# Patient Record
Sex: Female | Born: 1935 | Race: White | Hispanic: No | Marital: Married | Smoking: Former smoker
Health system: Southern US, Community
[De-identification: ages and names within clinical notes are randomized; demographics above are authoritative.]

## PROBLEM LIST (undated history)

## (undated) DIAGNOSIS — C50919 Malignant neoplasm of unspecified site of unspecified female breast: Secondary | ICD-10-CM

## (undated) DIAGNOSIS — I4891 Unspecified atrial fibrillation: Secondary | ICD-10-CM

## (undated) DIAGNOSIS — D696 Thrombocytopenia, unspecified: Secondary | ICD-10-CM

## (undated) DIAGNOSIS — E781 Pure hyperglyceridemia: Secondary | ICD-10-CM

## (undated) DIAGNOSIS — E039 Hypothyroidism, unspecified: Secondary | ICD-10-CM

## (undated) DIAGNOSIS — R0600 Dyspnea, unspecified: Secondary | ICD-10-CM

## (undated) DIAGNOSIS — H3530 Unspecified macular degeneration: Secondary | ICD-10-CM

## (undated) DIAGNOSIS — R002 Palpitations: Secondary | ICD-10-CM

## (undated) HISTORY — DX: Unspecified macular degeneration: H35.30

## (undated) HISTORY — DX: Dyspnea, unspecified: R06.00

## (undated) HISTORY — DX: Unspecified atrial fibrillation: I48.91

## (undated) HISTORY — PX: SHOULDER ARTHROSCOPY: SHX128

## (undated) HISTORY — DX: Pure hyperglyceridemia: E78.1

## (undated) HISTORY — PX: BREAST LUMPECTOMY: SHX2

## (undated) HISTORY — DX: Malignant neoplasm of unspecified site of unspecified female breast: C50.919

## (undated) HISTORY — PX: ROTATOR CUFF REPAIR: SHX139

## (undated) HISTORY — DX: Palpitations: R00.2

## (undated) HISTORY — DX: Thrombocytopenia, unspecified: D69.6

## (undated) HISTORY — DX: Hypothyroidism, unspecified: E03.9

---

## 2000-02-15 ENCOUNTER — Encounter: Payer: Self-pay | Admitting: Orthopedic Surgery

## 2000-02-15 ENCOUNTER — Encounter: Admission: RE | Admit: 2000-02-15 | Discharge: 2000-02-15 | Payer: Self-pay | Admitting: Orthopedic Surgery

## 2002-07-02 ENCOUNTER — Inpatient Hospital Stay (HOSPITAL_COMMUNITY): Admission: EM | Admit: 2002-07-02 | Discharge: 2002-07-04 | Payer: Self-pay | Admitting: *Deleted

## 2002-07-03 ENCOUNTER — Encounter: Payer: Self-pay | Admitting: Family Medicine

## 2002-07-16 ENCOUNTER — Encounter: Admission: RE | Admit: 2002-07-16 | Discharge: 2002-07-16 | Payer: Self-pay | Admitting: Family Medicine

## 2003-08-20 ENCOUNTER — Encounter: Admission: RE | Admit: 2003-08-20 | Discharge: 2003-08-20 | Payer: Self-pay | Admitting: Family Medicine

## 2003-08-20 ENCOUNTER — Encounter: Payer: Self-pay | Admitting: Family Medicine

## 2003-08-29 ENCOUNTER — Encounter: Payer: Self-pay | Admitting: Family Medicine

## 2003-08-29 ENCOUNTER — Encounter (INDEPENDENT_AMBULATORY_CARE_PROVIDER_SITE_OTHER): Payer: Self-pay | Admitting: Specialist

## 2003-08-29 ENCOUNTER — Encounter (INDEPENDENT_AMBULATORY_CARE_PROVIDER_SITE_OTHER): Payer: Self-pay | Admitting: Radiology

## 2003-08-29 ENCOUNTER — Encounter: Admission: RE | Admit: 2003-08-29 | Discharge: 2003-08-29 | Payer: Self-pay | Admitting: Family Medicine

## 2003-09-17 ENCOUNTER — Encounter: Admission: RE | Admit: 2003-09-17 | Discharge: 2003-09-17 | Payer: Self-pay | Admitting: Surgery

## 2003-09-19 ENCOUNTER — Ambulatory Visit (HOSPITAL_COMMUNITY): Admission: RE | Admit: 2003-09-19 | Discharge: 2003-09-19 | Payer: Self-pay | Admitting: Surgery

## 2003-09-19 ENCOUNTER — Encounter (INDEPENDENT_AMBULATORY_CARE_PROVIDER_SITE_OTHER): Payer: Self-pay | Admitting: *Deleted

## 2003-09-19 ENCOUNTER — Encounter: Admission: RE | Admit: 2003-09-19 | Discharge: 2003-09-19 | Payer: Self-pay | Admitting: Surgery

## 2003-09-19 ENCOUNTER — Ambulatory Visit (HOSPITAL_BASED_OUTPATIENT_CLINIC_OR_DEPARTMENT_OTHER): Admission: RE | Admit: 2003-09-19 | Discharge: 2003-09-19 | Payer: Self-pay | Admitting: Surgery

## 2003-10-11 ENCOUNTER — Ambulatory Visit: Admission: RE | Admit: 2003-10-11 | Discharge: 2003-12-04 | Payer: Self-pay | Admitting: Radiation Oncology

## 2003-11-01 ENCOUNTER — Ambulatory Visit (HOSPITAL_COMMUNITY): Admission: RE | Admit: 2003-11-01 | Discharge: 2003-11-01 | Payer: Self-pay | Admitting: Surgery

## 2004-01-02 ENCOUNTER — Ambulatory Visit: Admission: RE | Admit: 2004-01-02 | Discharge: 2004-01-02 | Payer: Self-pay | Admitting: Radiation Oncology

## 2004-05-07 ENCOUNTER — Ambulatory Visit: Admission: RE | Admit: 2004-05-07 | Discharge: 2004-05-07 | Payer: Self-pay | Admitting: Radiation Oncology

## 2004-05-12 ENCOUNTER — Encounter: Admission: RE | Admit: 2004-05-12 | Discharge: 2004-05-12 | Payer: Self-pay | Admitting: Radiation Oncology

## 2004-10-05 ENCOUNTER — Ambulatory Visit: Payer: Self-pay | Admitting: Family Medicine

## 2004-11-05 ENCOUNTER — Ambulatory Visit: Admission: RE | Admit: 2004-11-05 | Discharge: 2004-11-05 | Payer: Self-pay | Admitting: Radiation Oncology

## 2004-11-11 ENCOUNTER — Encounter: Admission: RE | Admit: 2004-11-11 | Discharge: 2004-11-11 | Payer: Self-pay | Admitting: Family Medicine

## 2004-11-23 ENCOUNTER — Ambulatory Visit: Payer: Self-pay | Admitting: Hematology & Oncology

## 2005-01-19 ENCOUNTER — Ambulatory Visit: Payer: Self-pay | Admitting: Family Medicine

## 2005-03-08 ENCOUNTER — Ambulatory Visit: Payer: Self-pay | Admitting: Family Medicine

## 2005-03-10 ENCOUNTER — Encounter: Admission: RE | Admit: 2005-03-10 | Discharge: 2005-03-10 | Payer: Self-pay | Admitting: Orthopedic Surgery

## 2005-03-11 ENCOUNTER — Ambulatory Visit (HOSPITAL_BASED_OUTPATIENT_CLINIC_OR_DEPARTMENT_OTHER): Admission: RE | Admit: 2005-03-11 | Discharge: 2005-03-11 | Payer: Self-pay | Admitting: Orthopedic Surgery

## 2005-03-11 ENCOUNTER — Ambulatory Visit (HOSPITAL_COMMUNITY): Admission: RE | Admit: 2005-03-11 | Discharge: 2005-03-11 | Payer: Self-pay | Admitting: Orthopedic Surgery

## 2005-05-05 ENCOUNTER — Ambulatory Visit: Admission: RE | Admit: 2005-05-05 | Discharge: 2005-05-05 | Payer: Self-pay | Admitting: Radiation Oncology

## 2005-05-21 ENCOUNTER — Ambulatory Visit: Payer: Self-pay | Admitting: Hematology & Oncology

## 2005-06-10 ENCOUNTER — Ambulatory Visit: Payer: Self-pay | Admitting: Internal Medicine

## 2005-06-15 ENCOUNTER — Ambulatory Visit: Payer: Self-pay | Admitting: Family Medicine

## 2005-06-22 ENCOUNTER — Ambulatory Visit: Payer: Self-pay

## 2005-07-26 ENCOUNTER — Ambulatory Visit: Payer: Self-pay | Admitting: Cardiology

## 2005-08-09 ENCOUNTER — Ambulatory Visit: Payer: Self-pay | Admitting: Family Medicine

## 2005-09-17 ENCOUNTER — Ambulatory Visit: Payer: Self-pay | Admitting: Hematology & Oncology

## 2005-09-28 ENCOUNTER — Encounter: Payer: Self-pay | Admitting: Hematology & Oncology

## 2005-09-28 ENCOUNTER — Encounter (INDEPENDENT_AMBULATORY_CARE_PROVIDER_SITE_OTHER): Payer: Self-pay | Admitting: *Deleted

## 2005-09-28 ENCOUNTER — Ambulatory Visit (HOSPITAL_COMMUNITY): Admission: RE | Admit: 2005-09-28 | Discharge: 2005-09-28 | Payer: Self-pay | Admitting: Hematology & Oncology

## 2005-10-01 ENCOUNTER — Ambulatory Visit: Payer: Self-pay | Admitting: Hematology & Oncology

## 2005-10-06 ENCOUNTER — Ambulatory Visit: Payer: Self-pay

## 2005-10-19 ENCOUNTER — Ambulatory Visit (HOSPITAL_COMMUNITY): Admission: RE | Admit: 2005-10-19 | Discharge: 2005-10-19 | Payer: Self-pay | Admitting: Hematology & Oncology

## 2005-10-21 ENCOUNTER — Ambulatory Visit: Payer: Self-pay | Admitting: Family Medicine

## 2005-11-16 ENCOUNTER — Ambulatory Visit: Payer: Self-pay | Admitting: Hematology & Oncology

## 2005-11-24 ENCOUNTER — Encounter: Admission: RE | Admit: 2005-11-24 | Discharge: 2005-11-24 | Payer: Self-pay | Admitting: Family Medicine

## 2006-01-26 ENCOUNTER — Ambulatory Visit: Payer: Self-pay | Admitting: Hematology & Oncology

## 2006-03-08 ENCOUNTER — Ambulatory Visit: Payer: Self-pay | Admitting: Family Medicine

## 2006-04-16 ENCOUNTER — Emergency Department (HOSPITAL_COMMUNITY): Admission: EM | Admit: 2006-04-16 | Discharge: 2006-04-16 | Payer: Self-pay | Admitting: Emergency Medicine

## 2006-05-02 ENCOUNTER — Ambulatory Visit: Payer: Self-pay | Admitting: Hematology & Oncology

## 2006-05-13 LAB — CHCC SMEAR

## 2006-05-13 LAB — CBC WITH DIFFERENTIAL/PLATELET
Basophils Absolute: 0 10*3/uL (ref 0.0–0.1)
EOS%: 2.5 % (ref 0.0–7.0)
Eosinophils Absolute: 0.1 10*3/uL (ref 0.0–0.5)
HGB: 12.4 g/dL (ref 11.6–15.9)
MCV: 89.3 fL (ref 81.0–101.0)
MONO%: 14.5 % — ABNORMAL HIGH (ref 0.0–13.0)
NEUT#: 1.6 10*3/uL (ref 1.5–6.5)
RBC: 4.09 10*6/uL (ref 3.70–5.32)
RDW: 14.2 % (ref 11.3–14.5)
lymph#: 0.9 10*3/uL (ref 0.9–3.3)

## 2006-08-03 ENCOUNTER — Ambulatory Visit: Payer: Self-pay | Admitting: Family Medicine

## 2006-08-17 ENCOUNTER — Ambulatory Visit: Payer: Self-pay | Admitting: Hematology & Oncology

## 2006-08-19 LAB — CBC WITH DIFFERENTIAL/PLATELET
BASO%: 0.4 % (ref 0.0–2.0)
EOS%: 3.7 % (ref 0.0–7.0)
HCT: 36.5 % (ref 34.8–46.6)
MCH: 30.4 pg (ref 26.0–34.0)
MCHC: 34.3 g/dL (ref 32.0–36.0)
MONO#: 0.5 10*3/uL (ref 0.1–0.9)
NEUT%: 49.8 % (ref 39.6–76.8)
RBC: 4.13 10*6/uL (ref 3.70–5.32)
RDW: 13.9 % (ref 11.3–14.5)
WBC: 2.9 10*3/uL — ABNORMAL LOW (ref 3.9–10.0)
lymph#: 0.9 10*3/uL (ref 0.9–3.3)

## 2006-08-19 LAB — CHCC SMEAR

## 2006-09-08 ENCOUNTER — Emergency Department (HOSPITAL_COMMUNITY): Admission: EM | Admit: 2006-09-08 | Discharge: 2006-09-08 | Payer: Self-pay | Admitting: Emergency Medicine

## 2006-11-15 ENCOUNTER — Ambulatory Visit: Payer: Self-pay | Admitting: Hematology & Oncology

## 2006-11-18 LAB — CBC WITH DIFFERENTIAL/PLATELET
BASO%: 0.6 % (ref 0.0–2.0)
Eosinophils Absolute: 0.1 10*3/uL (ref 0.0–0.5)
HCT: 38.6 % (ref 34.8–46.6)
MCHC: 32.1 g/dL (ref 32.0–36.0)
MONO#: 0.5 10*3/uL (ref 0.1–0.9)
NEUT#: 1.9 10*3/uL (ref 1.5–6.5)
RBC: 4.35 10*6/uL (ref 3.70–5.32)
WBC: 3.8 10*3/uL — ABNORMAL LOW (ref 3.9–10.0)
lymph#: 1.1 10*3/uL (ref 0.9–3.3)

## 2007-02-07 ENCOUNTER — Ambulatory Visit: Payer: Self-pay | Admitting: Hematology & Oncology

## 2007-02-07 ENCOUNTER — Ambulatory Visit: Payer: Self-pay | Admitting: Family Medicine

## 2007-02-10 LAB — CHCC SMEAR

## 2007-02-10 LAB — CBC WITH DIFFERENTIAL/PLATELET
Basophils Absolute: 0 10*3/uL (ref 0.0–0.1)
Eosinophils Absolute: 0.1 10*3/uL (ref 0.0–0.5)
HGB: 12.6 g/dL (ref 11.6–15.9)
MCV: 87.5 fL (ref 81.0–101.0)
MONO#: 0.4 10*3/uL (ref 0.1–0.9)
MONO%: 13.4 % — ABNORMAL HIGH (ref 0.0–13.0)
NEUT#: 1.6 10*3/uL (ref 1.5–6.5)
Platelets: 89 10*3/uL — ABNORMAL LOW (ref 145–400)
RBC: 4.22 10*6/uL (ref 3.70–5.32)
RDW: 12.2 % (ref 11.3–14.5)
WBC: 3 10*3/uL — ABNORMAL LOW (ref 3.9–10.0)

## 2007-06-08 ENCOUNTER — Ambulatory Visit: Payer: Self-pay | Admitting: Hematology & Oncology

## 2007-06-12 LAB — CBC WITH DIFFERENTIAL/PLATELET
Basophils Absolute: 0 10*3/uL (ref 0.0–0.1)
Eosinophils Absolute: 0 10*3/uL (ref 0.0–0.5)
HCT: 35.5 % (ref 34.8–46.6)
HGB: 12.5 g/dL (ref 11.6–15.9)
LYMPH%: 34.6 % (ref 14.0–48.0)
MCHC: 35.1 g/dL (ref 32.0–36.0)
MONO#: 0.5 10*3/uL (ref 0.1–0.9)
NEUT%: 51.1 % (ref 39.6–76.8)
Platelets: 90 10*3/uL — ABNORMAL LOW (ref 145–400)
WBC: 3.5 10*3/uL — ABNORMAL LOW (ref 3.9–10.0)
lymph#: 1.2 10*3/uL (ref 0.9–3.3)

## 2007-06-12 LAB — CHCC SMEAR

## 2007-10-02 ENCOUNTER — Ambulatory Visit: Payer: Self-pay | Admitting: Hematology & Oncology

## 2007-10-09 LAB — CBC WITH DIFFERENTIAL/PLATELET
Basophils Absolute: 0 10*3/uL (ref 0.0–0.1)
EOS%: 4.1 % (ref 0.0–7.0)
Eosinophils Absolute: 0.1 10*3/uL (ref 0.0–0.5)
HCT: 35 % (ref 34.8–46.6)
HGB: 12.5 g/dL (ref 11.6–15.9)
LYMPH%: 27.6 % (ref 14.0–48.0)
MCH: 31 pg (ref 26.0–34.0)
MCV: 86.9 fL (ref 81.0–101.0)
MONO%: 13 % (ref 0.0–13.0)
NEUT#: 1.7 10*3/uL (ref 1.5–6.5)
NEUT%: 54.9 % (ref 39.6–76.8)
Platelets: 89 10*3/uL — ABNORMAL LOW (ref 145–400)
RDW: 13.9 % (ref 11.3–14.5)

## 2007-10-09 LAB — CHCC SMEAR

## 2007-10-20 ENCOUNTER — Ambulatory Visit: Payer: Self-pay | Admitting: Cardiovascular Disease

## 2007-10-20 ENCOUNTER — Encounter: Payer: Self-pay | Admitting: Cardiovascular Disease

## 2007-10-20 ENCOUNTER — Inpatient Hospital Stay (HOSPITAL_COMMUNITY): Admission: EM | Admit: 2007-10-20 | Discharge: 2007-10-22 | Payer: Self-pay | Admitting: Emergency Medicine

## 2007-10-27 ENCOUNTER — Ambulatory Visit: Payer: Self-pay

## 2007-11-16 ENCOUNTER — Ambulatory Visit: Payer: Self-pay | Admitting: Cardiology

## 2007-11-21 ENCOUNTER — Encounter: Payer: Self-pay | Admitting: Cardiology

## 2007-11-21 ENCOUNTER — Ambulatory Visit: Payer: Self-pay

## 2007-12-01 ENCOUNTER — Ambulatory Visit: Payer: Self-pay | Admitting: Internal Medicine

## 2007-12-08 ENCOUNTER — Ambulatory Visit: Payer: Self-pay | Admitting: Cardiology

## 2007-12-08 LAB — CONVERTED CEMR LAB
Basophils Absolute: 0 10*3/uL (ref 0.0–0.1)
Basophils Relative: 1 % (ref 0.0–1.0)
Eosinophils Absolute: 0.2 10*3/uL (ref 0.0–0.6)
Eosinophils Relative: 3.4 % (ref 0.0–5.0)
HCT: 37.8 % (ref 36.0–46.0)
Hemoglobin: 12.6 g/dL (ref 12.0–15.0)
Lymphocytes Relative: 25 % (ref 12.0–46.0)
MCHC: 33.2 g/dL (ref 30.0–36.0)
MCV: 90.4 fL (ref 78.0–100.0)
Monocytes Absolute: 0.7 10*3/uL (ref 0.2–0.7)
Monocytes Relative: 14.2 % — ABNORMAL HIGH (ref 3.0–11.0)
Neutro Abs: 2.8 10*3/uL (ref 1.4–7.7)
Neutrophils Relative %: 56.4 % (ref 43.0–77.0)
Platelets: 82 10*3/uL — ABNORMAL LOW (ref 150–400)
RBC: 4.18 M/uL (ref 3.87–5.11)
RDW: 13 % (ref 11.5–14.6)
WBC: 4.9 10*3/uL (ref 4.5–10.5)

## 2007-12-20 ENCOUNTER — Encounter: Admission: RE | Admit: 2007-12-20 | Discharge: 2007-12-20 | Payer: Self-pay | Admitting: Radiation Oncology

## 2008-03-11 ENCOUNTER — Ambulatory Visit: Payer: Self-pay | Admitting: Hematology & Oncology

## 2008-03-13 LAB — CBC WITH DIFFERENTIAL/PLATELET
BASO%: 0.8 % (ref 0.0–2.0)
Basophils Absolute: 0 10*3/uL (ref 0.0–0.1)
EOS%: 4.3 % (ref 0.0–7.0)
HGB: 13.2 g/dL (ref 11.6–15.9)
MCH: 30.9 pg (ref 26.0–34.0)
MCV: 90.1 fL (ref 81.0–101.0)
MONO%: 14.1 % — ABNORMAL HIGH (ref 0.0–13.0)
RBC: 4.27 10*6/uL (ref 3.70–5.32)
RDW: 13.3 % (ref 11.3–14.5)
lymph#: 1.1 10*3/uL (ref 0.9–3.3)

## 2008-03-13 LAB — FERRITIN: Ferritin: 194 ng/mL (ref 10–291)

## 2008-03-13 LAB — CHCC SMEAR

## 2008-03-18 ENCOUNTER — Ambulatory Visit: Payer: Self-pay | Admitting: Cardiology

## 2008-04-02 IMAGING — CT CT CERVICAL SPINE W/O CM
2 of 3 series · 8 of 14 positions shown, 9 images · IV contrast (agent unspecified)
Comparison: None

CLINICAL DATA: Fall, confusion, neck pain

HEAD CT WITHOUT CONTRAST:
TECHNIQUE: 5mm collimated images were obtained from the base of the skull
through the vertex according to standard protocol without contrast.
TECHNIQUE: Multidetector CT imaging of the cervical spine was performed. 
Sagittal and coronal plane reformatted images were reconstructed from the axial
CT data, and were also reviewed.

[Series 4: c_spine 2.0 b31s · axial · 0.23mm/px · z∈[-270,-178]mm · 4 of 78 slices shown]
[im 16/78  bone]
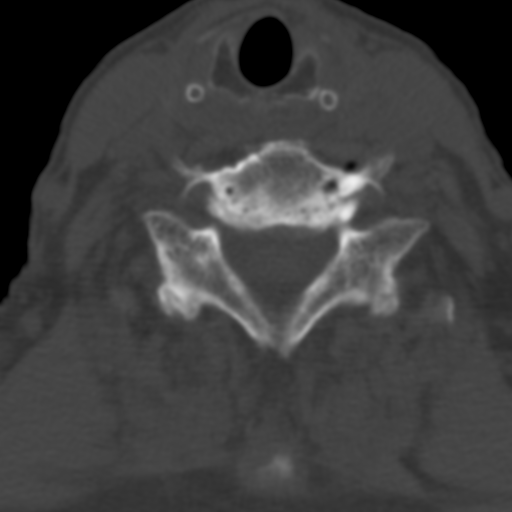
[im 31/78  bone]
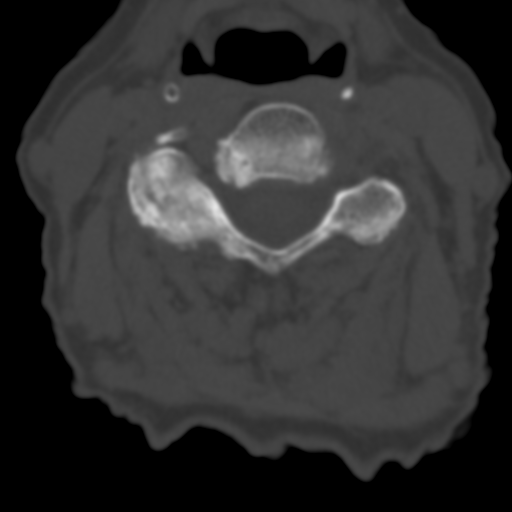
[im 47/78  bone]
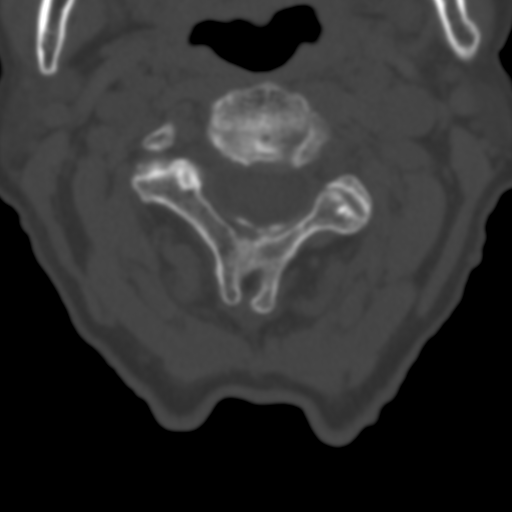
[im 62/78  bone]
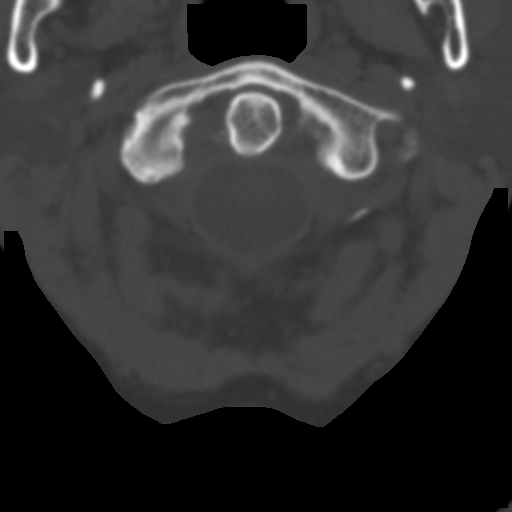

[Series 604: <mpr thick range(2)> · axial · 0.30mm/px · z∈[-303,-211]mm · 4 of 84 slices shown, 5 images]
[im 17/84  soft-tissue]
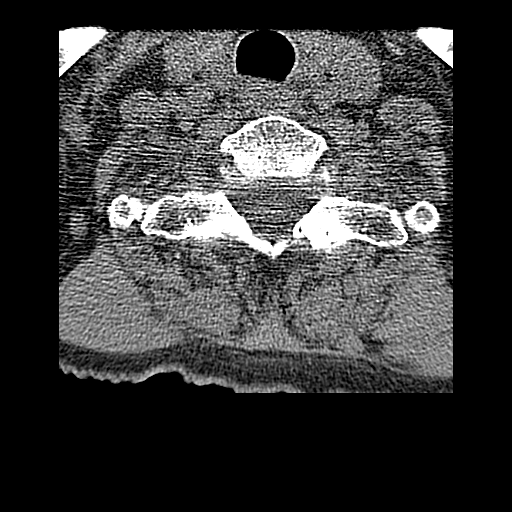
[im 17/84  bone]
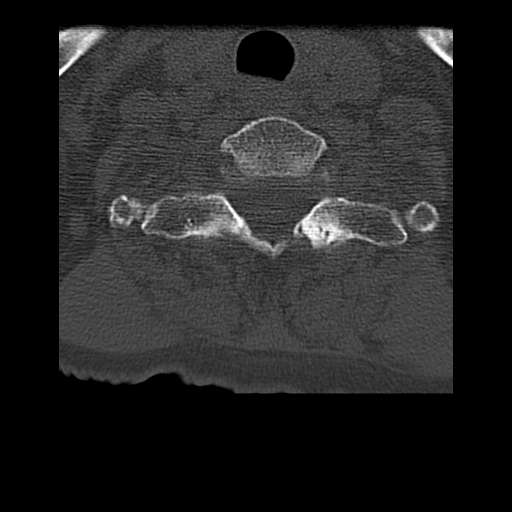
[im 34/84  bone]
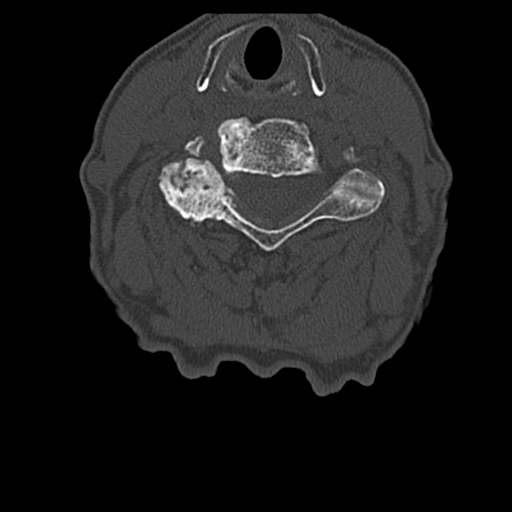
[im 50/84  bone]
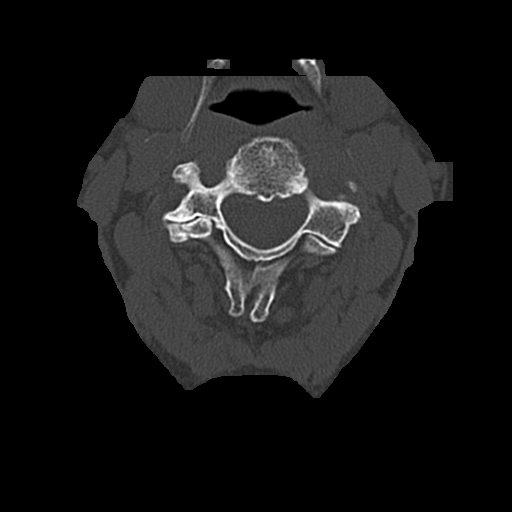
[im 67/84  bone]
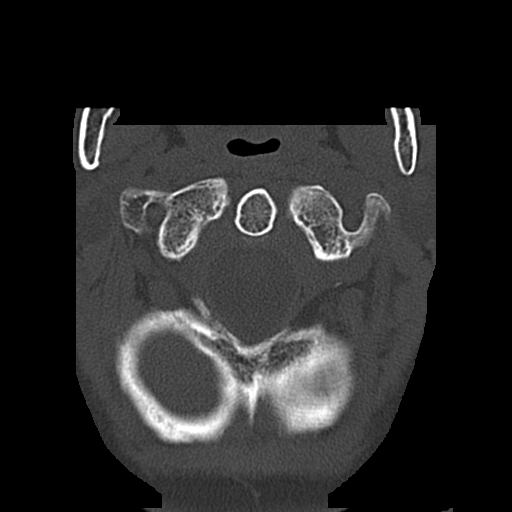

[8 of 14 positions shown; findings below may reference images not displayed]

FINDINGS: There is no evidence of intracranial hemorrhage, hydrocephalus, mass
lesion, or acute infarction.    No abnormal extra-axial fluid collections 
identified.  No skull abnormalities are noted.

There is mild  atrophy and changes of chronic small vessel disease in the deep
white matter.
IMPRESSION: No acute intracranial abnormalities.

Atrophy and chronic small vessel disease.

CERVICAL SPINE CT WITHOUT CONTRAST
FINDINGS: There is diffuse cervical spondylosis, most pronounced at C5-C6 and
C6-C7. Facet disease noted bilaterally, right worse than left. There is normal
alignment. Moderate to severe right neuroforaminal narrowing noted at C5-C6. No
fracture. Prevertebral soft tissues are normal.

IMPRESSION

Spondylosis and facet disease. No acute findings.

## 2008-05-01 ENCOUNTER — Ambulatory Visit: Payer: Self-pay | Admitting: Cardiology

## 2008-08-05 ENCOUNTER — Ambulatory Visit: Payer: Self-pay | Admitting: Cardiology

## 2008-08-20 ENCOUNTER — Ambulatory Visit: Payer: Self-pay | Admitting: Hematology & Oncology

## 2008-08-21 LAB — RETICULOCYTES (CHCC)
ABS Retic: 53.3 10*3/uL (ref 19.0–186.0)
RBC.: 4.1 MIL/uL (ref 3.87–5.11)
Retic Ct Pct: 1.3 % (ref 0.4–3.1)

## 2008-08-21 LAB — CBC WITH DIFFERENTIAL (CANCER CENTER ONLY)
BASO#: 0 10*3/uL (ref 0.0–0.2)
EOS%: 5.9 % (ref 0.0–7.0)
HCT: 36.8 % (ref 34.8–46.6)
HGB: 12.5 g/dL (ref 11.6–15.9)
LYMPH%: 30.8 % (ref 14.0–48.0)
MCH: 29.9 pg (ref 26.0–34.0)
MCHC: 33.9 g/dL (ref 32.0–36.0)
MCV: 88 fL (ref 81–101)
MONO%: 11.8 % (ref 0.0–13.0)
NEUT#: 1.6 10*3/uL (ref 1.5–6.5)
NEUT%: 50.9 % (ref 39.6–80.0)

## 2008-08-21 LAB — FERRITIN: Ferritin: 186 ng/mL (ref 10–291)

## 2008-11-20 ENCOUNTER — Ambulatory Visit: Payer: Self-pay | Admitting: Hematology & Oncology

## 2008-11-21 LAB — CBC WITH DIFFERENTIAL (CANCER CENTER ONLY)
BASO#: 0 10*3/uL (ref 0.0–0.2)
EOS%: 4.7 % (ref 0.0–7.0)
Eosinophils Absolute: 0.1 10*3/uL (ref 0.0–0.5)
HCT: 37.1 % (ref 34.8–46.6)
HGB: 12.8 g/dL (ref 11.6–15.9)
LYMPH#: 0.9 10*3/uL (ref 0.9–3.3)
MCHC: 34.6 g/dL (ref 32.0–36.0)
NEUT%: 52.8 % (ref 39.6–80.0)

## 2008-11-21 LAB — COMPREHENSIVE METABOLIC PANEL
ALT: 14 U/L (ref 0–35)
Alkaline Phosphatase: 71 U/L (ref 39–117)
Creatinine, Ser: 1 mg/dL (ref 0.40–1.20)
Glucose, Bld: 73 mg/dL (ref 70–99)
Sodium: 142 mEq/L (ref 135–145)
Total Bilirubin: 0.5 mg/dL (ref 0.3–1.2)
Total Protein: 6.6 g/dL (ref 6.0–8.3)

## 2008-11-21 LAB — RETICULOCYTES (CHCC)
ABS Retic: 59.8 10*3/uL (ref 19.0–186.0)
Retic Ct Pct: 1.4 % (ref 0.4–3.1)

## 2008-11-21 LAB — CHCC SATELLITE - SMEAR

## 2008-12-23 ENCOUNTER — Encounter: Admission: RE | Admit: 2008-12-23 | Discharge: 2008-12-23 | Payer: Self-pay | Admitting: Family Medicine

## 2008-12-30 ENCOUNTER — Ambulatory Visit: Payer: Self-pay | Admitting: Cardiology

## 2009-05-15 ENCOUNTER — Ambulatory Visit: Payer: Self-pay | Admitting: Hematology & Oncology

## 2009-05-16 LAB — CBC WITH DIFFERENTIAL (CANCER CENTER ONLY)
BASO#: 0 10*3/uL (ref 0.0–0.2)
BASO%: 0.6 % (ref 0.0–2.0)
HCT: 40 % (ref 34.8–46.6)
HGB: 13.2 g/dL (ref 11.6–15.9)
LYMPH#: 0.8 10*3/uL — ABNORMAL LOW (ref 0.9–3.3)
MONO#: 0.3 10*3/uL (ref 0.1–0.9)
NEUT%: 50.9 % (ref 39.6–80.0)
RDW: 12 % (ref 10.5–14.6)
WBC: 2.5 10*3/uL — ABNORMAL LOW (ref 3.9–10.0)

## 2009-05-16 LAB — FERRITIN: Ferritin: 210 ng/mL (ref 10–291)

## 2009-05-19 ENCOUNTER — Ambulatory Visit: Payer: Self-pay | Admitting: Diagnostic Radiology

## 2009-05-19 ENCOUNTER — Ambulatory Visit (HOSPITAL_BASED_OUTPATIENT_CLINIC_OR_DEPARTMENT_OTHER): Admission: RE | Admit: 2009-05-19 | Discharge: 2009-05-19 | Payer: Self-pay | Admitting: Hematology & Oncology

## 2009-07-12 DIAGNOSIS — M810 Age-related osteoporosis without current pathological fracture: Secondary | ICD-10-CM | POA: Insufficient documentation

## 2009-07-12 DIAGNOSIS — Z8679 Personal history of other diseases of the circulatory system: Secondary | ICD-10-CM | POA: Insufficient documentation

## 2009-07-12 DIAGNOSIS — E781 Pure hyperglyceridemia: Secondary | ICD-10-CM | POA: Insufficient documentation

## 2009-07-12 DIAGNOSIS — C50919 Malignant neoplasm of unspecified site of unspecified female breast: Secondary | ICD-10-CM | POA: Insufficient documentation

## 2009-07-12 DIAGNOSIS — E039 Hypothyroidism, unspecified: Secondary | ICD-10-CM | POA: Insufficient documentation

## 2009-07-12 DIAGNOSIS — H3530 Unspecified macular degeneration: Secondary | ICD-10-CM | POA: Insufficient documentation

## 2009-07-12 DIAGNOSIS — D696 Thrombocytopenia, unspecified: Secondary | ICD-10-CM | POA: Insufficient documentation

## 2009-07-12 DIAGNOSIS — R002 Palpitations: Secondary | ICD-10-CM

## 2009-07-12 DIAGNOSIS — R0602 Shortness of breath: Secondary | ICD-10-CM | POA: Insufficient documentation

## 2009-07-16 ENCOUNTER — Ambulatory Visit: Payer: Self-pay | Admitting: Cardiology

## 2009-07-16 DIAGNOSIS — M549 Dorsalgia, unspecified: Secondary | ICD-10-CM | POA: Insufficient documentation

## 2009-07-30 ENCOUNTER — Ambulatory Visit: Payer: Self-pay | Admitting: Hematology & Oncology

## 2009-07-31 LAB — COMPREHENSIVE METABOLIC PANEL
AST: 21 U/L (ref 0–37)
Alkaline Phosphatase: 63 U/L (ref 39–117)
BUN: 25 mg/dL — ABNORMAL HIGH (ref 6–23)
Creatinine, Ser: 0.98 mg/dL (ref 0.40–1.20)

## 2009-07-31 LAB — CBC WITH DIFFERENTIAL (CANCER CENTER ONLY)
BASO%: 0.3 % (ref 0.0–2.0)
LYMPH#: 1 10*3/uL (ref 0.9–3.3)
MONO#: 0.4 10*3/uL (ref 0.1–0.9)
NEUT#: 1.4 10*3/uL — ABNORMAL LOW (ref 1.5–6.5)
Platelets: 82 10*3/uL — ABNORMAL LOW (ref 145–400)
RDW: 12 % (ref 10.5–14.6)
WBC: 2.9 10*3/uL — ABNORMAL LOW (ref 3.9–10.0)

## 2009-08-07 ENCOUNTER — Telehealth (INDEPENDENT_AMBULATORY_CARE_PROVIDER_SITE_OTHER): Payer: Self-pay | Admitting: *Deleted

## 2009-08-11 ENCOUNTER — Ambulatory Visit: Payer: Self-pay

## 2009-08-11 ENCOUNTER — Ambulatory Visit: Payer: Self-pay | Admitting: Cardiovascular Disease

## 2009-08-11 ENCOUNTER — Encounter (HOSPITAL_COMMUNITY): Admission: RE | Admit: 2009-08-11 | Discharge: 2009-10-10 | Payer: Self-pay | Admitting: Cardiovascular Disease

## 2009-08-13 ENCOUNTER — Encounter (INDEPENDENT_AMBULATORY_CARE_PROVIDER_SITE_OTHER): Payer: Self-pay | Admitting: *Deleted

## 2009-08-14 ENCOUNTER — Ambulatory Visit: Payer: Self-pay | Admitting: Cardiology

## 2009-12-24 ENCOUNTER — Encounter: Admission: RE | Admit: 2009-12-24 | Discharge: 2009-12-24 | Payer: Self-pay | Admitting: Family Medicine

## 2009-12-31 ENCOUNTER — Ambulatory Visit: Payer: Self-pay | Admitting: Hematology & Oncology

## 2010-01-01 LAB — CHCC SATELLITE - SMEAR

## 2010-01-01 LAB — CBC WITH DIFFERENTIAL (CANCER CENTER ONLY)
BASO#: 0 10*3/uL (ref 0.0–0.2)
BASO%: 0.3 % (ref 0.0–2.0)
EOS%: 4.6 % (ref 0.0–7.0)
Eosinophils Absolute: 0.2 10*3/uL (ref 0.0–0.5)
HCT: 38.2 % (ref 34.8–46.6)
HGB: 12.9 g/dL (ref 11.6–15.9)
LYMPH#: 1.1 10*3/uL (ref 0.9–3.3)
LYMPH%: 29.9 % (ref 14.0–48.0)
MCH: 30.3 pg (ref 26.0–34.0)
MCHC: 33.7 g/dL (ref 32.0–36.0)
MCV: 90 fL (ref 81–101)
MONO#: 0.4 10*3/uL (ref 0.1–0.9)
MONO%: 10.5 % (ref 0.0–13.0)
NEUT#: 1.9 10*3/uL (ref 1.5–6.5)
NEUT%: 54.7 % (ref 39.6–80.0)
Platelets: 89 10*3/uL — ABNORMAL LOW (ref 145–400)
RBC: 4.25 10*6/uL (ref 3.70–5.32)
RDW: 12.4 % (ref 10.5–14.6)
WBC: 3.5 10*3/uL — ABNORMAL LOW (ref 3.9–10.0)

## 2010-01-01 LAB — COMPREHENSIVE METABOLIC PANEL
ALT: 22 U/L (ref 0–35)
AST: 23 U/L (ref 0–37)
Albumin: 4.5 g/dL (ref 3.5–5.2)
Alkaline Phosphatase: 74 U/L (ref 39–117)
BUN: 22 mg/dL (ref 6–23)
CO2: 28 mEq/L (ref 19–32)
Calcium: 10.2 mg/dL (ref 8.4–10.5)
Chloride: 104 mEq/L (ref 96–112)
Creatinine, Ser: 0.98 mg/dL (ref 0.40–1.20)
Glucose, Bld: 86 mg/dL (ref 70–99)
Potassium: 4 mEq/L (ref 3.5–5.3)
Sodium: 139 mEq/L (ref 135–145)
Total Bilirubin: 0.5 mg/dL (ref 0.3–1.2)
Total Protein: 6.8 g/dL (ref 6.0–8.3)

## 2010-01-01 LAB — VITAMIN D 25 HYDROXY (VIT D DEFICIENCY, FRACTURES): Vit D, 25-Hydroxy: 40 ng/mL (ref 30–89)

## 2010-03-11 ENCOUNTER — Ambulatory Visit: Payer: Self-pay | Admitting: Cardiology

## 2010-03-11 DIAGNOSIS — R5383 Other fatigue: Secondary | ICD-10-CM

## 2010-03-11 DIAGNOSIS — R5381 Other malaise: Secondary | ICD-10-CM

## 2010-06-02 ENCOUNTER — Encounter: Admission: RE | Admit: 2010-06-02 | Discharge: 2010-06-02 | Payer: Self-pay | Admitting: Family Medicine

## 2010-07-07 ENCOUNTER — Ambulatory Visit: Payer: Self-pay | Admitting: Hematology & Oncology

## 2010-07-08 LAB — CBC WITH DIFFERENTIAL (CANCER CENTER ONLY)
BASO#: 0 10*3/uL (ref 0.0–0.2)
BASO%: 0.4 % (ref 0.0–2.0)
EOS%: 4.3 % (ref 0.0–7.0)
Eosinophils Absolute: 0.1 10*3/uL (ref 0.0–0.5)
HCT: 38.7 % (ref 34.8–46.6)
HGB: 12.8 g/dL (ref 11.6–15.9)
LYMPH#: 0.8 10*3/uL — ABNORMAL LOW (ref 0.9–3.3)
LYMPH%: 34.2 % (ref 14.0–48.0)
MCH: 29.7 pg (ref 26.0–34.0)
MCHC: 33.1 g/dL (ref 32.0–36.0)
MCV: 90 fL (ref 81–101)
MONO#: 0.3 10*3/uL (ref 0.1–0.9)
MONO%: 13 % (ref 0.0–13.0)
NEUT#: 1.2 10*3/uL — ABNORMAL LOW (ref 1.5–6.5)
NEUT%: 48.1 % (ref 39.6–80.0)
Platelets: 76 10*3/uL — ABNORMAL LOW (ref 145–400)
RBC: 4.3 10*6/uL (ref 3.70–5.32)
RDW: 12.3 % (ref 10.5–14.6)
WBC: 2.4 10*3/uL — ABNORMAL LOW (ref 3.9–10.0)

## 2010-07-08 LAB — COMPREHENSIVE METABOLIC PANEL
ALT: 18 U/L (ref 0–35)
AST: 22 U/L (ref 0–37)
Albumin: 4.1 g/dL (ref 3.5–5.2)
Alkaline Phosphatase: 67 U/L (ref 39–117)
BUN: 27 mg/dL — ABNORMAL HIGH (ref 6–23)
CO2: 29 mEq/L (ref 19–32)
Calcium: 9.9 mg/dL (ref 8.4–10.5)
Chloride: 104 mEq/L (ref 96–112)
Creatinine, Ser: 0.97 mg/dL (ref 0.40–1.20)
Glucose, Bld: 70 mg/dL (ref 70–99)
Potassium: 4.5 mEq/L (ref 3.5–5.3)
Sodium: 140 mEq/L (ref 135–145)
Total Bilirubin: 0.6 mg/dL (ref 0.3–1.2)
Total Protein: 6.4 g/dL (ref 6.0–8.3)

## 2010-07-08 LAB — CHCC SATELLITE - SMEAR

## 2010-07-08 LAB — VITAMIN D 25 HYDROXY (VIT D DEFICIENCY, FRACTURES): Vit D, 25-Hydroxy: 34 ng/mL (ref 30–89)

## 2010-07-15 ENCOUNTER — Encounter (INDEPENDENT_AMBULATORY_CARE_PROVIDER_SITE_OTHER): Payer: Self-pay | Admitting: *Deleted

## 2010-09-22 ENCOUNTER — Encounter: Payer: Self-pay | Admitting: Cardiology

## 2010-09-22 ENCOUNTER — Ambulatory Visit: Payer: Self-pay | Admitting: Cardiology

## 2010-09-22 DIAGNOSIS — Z8679 Personal history of other diseases of the circulatory system: Secondary | ICD-10-CM

## 2010-10-06 ENCOUNTER — Ambulatory Visit (HOSPITAL_BASED_OUTPATIENT_CLINIC_OR_DEPARTMENT_OTHER)
Admission: RE | Admit: 2010-10-06 | Discharge: 2010-10-06 | Payer: Self-pay | Source: Home / Self Care | Admitting: Cardiology

## 2010-10-06 ENCOUNTER — Encounter: Payer: Self-pay | Admitting: Pulmonary Disease

## 2010-10-09 ENCOUNTER — Ambulatory Visit: Payer: Self-pay | Admitting: Pulmonary Disease

## 2010-10-09 DIAGNOSIS — G4700 Insomnia, unspecified: Secondary | ICD-10-CM

## 2010-10-12 ENCOUNTER — Ambulatory Visit: Payer: Self-pay | Admitting: Hematology & Oncology

## 2010-10-14 LAB — CBC WITH DIFFERENTIAL (CANCER CENTER ONLY)
BASO#: 0 10*3/uL (ref 0.0–0.2)
EOS%: 4 % (ref 0.0–7.0)
Eosinophils Absolute: 0.1 10*3/uL (ref 0.0–0.5)
HGB: 12.8 g/dL (ref 11.6–15.9)
LYMPH#: 0.9 10*3/uL (ref 0.9–3.3)
MCHC: 33.7 g/dL (ref 32.0–36.0)
MONO#: 0.4 10*3/uL (ref 0.1–0.9)
NEUT#: 2 10*3/uL (ref 1.5–6.5)
RBC: 4.14 10*6/uL (ref 3.70–5.32)
WBC: 3.4 10*3/uL — ABNORMAL LOW (ref 3.9–10.0)

## 2010-10-14 LAB — RETICULOCYTES (CHCC)
ABS Retic: 49.6 10*3/uL (ref 19.0–186.0)
RBC.: 4.13 MIL/uL (ref 3.87–5.11)
Retic Ct Pct: 1.2 % (ref 0.4–3.1)

## 2010-12-08 NOTE — Assessment & Plan Note (Signed)
Summary: f20m   Visit Type:  Follow-up Primary Provider:  Lysbeth Galas, MD   History of Present Illness: Overall doing ok.  Does not sleep all that well.  Stays awake and wakes up at night, then dozes during the day.  Has rarely noticed weakness of R hand, dropping things, and left foot.  Variable.  Rare episodes. Denies chest pain.  Has not noted herself out of rhythm.  Problems Prior to Update: 1)  Weakness  (ICD-780.79) 2)  Back Pain  (ICD-724.5) 3)  Atrial Fibrillation, Paroxysmal, Hx of  (ICD-V12.50) 4)  Hypertriglyceridemia  (ICD-272.1) 5)  Palpitations  (ICD-785.1) 6)  Dyspnea  (ICD-786.05) 7)  Thrombocytopenia, Chronic  (ICD-287.5) 8)  Hypothyroidism  (ICD-244.9) 9)  Breast Cancer  (ICD-174.9) 10)  Osteoporosis  (ICD-733.00) 11)  Macular Degeneration  (ICD-362.50)  Current Medications (verified): 1)  Oscal 500/200 D-3 500-200 Mg-Unit Tabs (Calcium-Vitamin D) .... Two Times A Day 2)  Levoxyl 75 Mcg Tabs (Levothyroxine Sodium) .... Once Daily 3)  Aspir-Low 81 Mg Tbec (Aspirin) .... Once Daily 4)  Metoprolol Tartrate 25 Mg Tabs (Metoprolol Tartrate) .... 1/2 Tab Two Times A Day 5)  Fish Oil 1000 Mg Caps (Omega-3 Fatty Acids) .Marland Kitchen.. 1cap  Two Times A Day 6)  Icaps  Caps (Multiple Vitamins-Minerals) .Marland Kitchen.. 1 Cap Two Times A Day 7)  Citalopram Hydrobromide 10 Mg Tabs (Citalopram Hydrobromide) .... Take 1 Tablet By Mouth Once A Day  Allergies (verified): No Known Drug Allergies  Past History:  Past Medical History: Last updated: August 06, 2009 Current Problems:  ATRIAL FIBRILLATION, PAROXYSMAL, HX OF (ICD-V12.50) HYPERTRIGLYCERIDEMIA (ICD-272.1) PALPITATIONS (ICD-785.1) DYSPNEA (ICD-786.05) THROMBOCYTOPENIA, CHRONIC (ICD-287.5) HYPOTHYROIDISM (ICD-244.9) BREAST CANCER (ICD-174.9) OSTEOPOROSIS (ICD-733.00) MACULAR DEGENERATION (ICD-362.50)  Past Surgical History: Last updated: 06-Aug-2009 .Right shoulder rotator cuff repair.  .Breast lumpectomy.   Arthroscopy     Family  History: Last updated: 08-06-09   Mother died of complications of Parkinson's at 41.   Father died of prostate cancer.  She has one brother with prostate   cancer.   Social History: Last updated: 08-06-09  SOCIAL HISTORY:  The patient lives in [place] with her husband.  She is  retired  She has been married for 14 years.  She has  four children.  She has a 60 pack-year history, but has stopped smoking   since 1984 and has an occasional glass of wine, no drugs.  No exercise  program.   Vital Signs:  Patient profile:   77 year old female Height:      68 inches Weight:      153.50 pounds BMI:     23.42 Pulse rate:   48 / minute Pulse rhythm:   irregular Resp:     18 per minute BP sitting:   104 / 66  (left arm) Cuff size:   large  Vitals Entered By: Vikki Ports (September 22, 2010 11:38 AM)  Physical Exam  General:  Well developed, well nourished, in no acute distress. Head:  normocephalic and atraumatic Eyes:  PERRLA/EOM intact; conjunctiva and lids normal. Ears:  TM's intact and clear with normal canals and hearing Neck:  no bruits Lungs:  Clear bilaterally to auscultation and percussion. Heart:  regular rate no murmur or rub. Pulses:  pulses normal in all 4 extremities Extremities:  No clubbing or cyanosis. Neurologic:  Alert and oriented x 3.   EKG  Procedure date:  09/22/2010  Findings:      Marked SB.  Incomplete RBBB.  Impression & Recommendations:  Problem # 1:  ATRIAL FIBRILLATION, PAROXYSMAL, HX OF (ICD-V12.50) Has been seen by [facility].  Not a good candidate for antithrombotic treatment, with low platelete, occasional falls.  Denies episodes.  Does have some intermittent weakness   ?MRI need.  No bruits on exam.  Will also get sleep study based on her history.    Problem # 2:  PALPITATIONS (ICD-785.1) very few at this point. Her updated medication list for this problem includes:    Aspir-low 81 Mg Tbec (Aspirin) ..... Once daily    Metoprolol Tartrate 25  Mg Tabs (Metoprolol tartrate) .Marland Kitchen... 1/2 tab two times a day  Problem # 3:  RISK OF SLEEP APNEA (ICD-V12.59)  History described.  May or may not have, but worth checking.  If neg, may get MRI.   Her updated medication list for this problem includes:    Aspir-low 81 Mg Tbec (Aspirin) ..... Once daily    Metoprolol Tartrate 25 Mg Tabs (Metoprolol tartrate) .Marland Kitchen... 1/2 tab two times a day  Orders: Sleep Disorder Referral (Sleep Disorder)  Patient Instructions: 1)  Your physician recommends that you schedule a follow-up appointment in: 3months 2)  Your physician has recommended that you have a sleep study.  This test records several body functions during sleep, including:  brain activity, eye movement, oxygen and carbon dioxide blood levels, heart rate and rhythm, breathing rate and rhythm, the flow of air through your mouth and nose, snoring, body muscle movements, and chest and belly movement.--please refer [facility] for sleep study

## 2010-12-08 NOTE — Letter (Signed)
Summary: Colonoscopy Letter  [facility]  [address] [place], [place] [address]   Phone: [phone]  Fax: [phone]      July 15, 2010 MRN: [id number]   [facility] [address] [place], [place]  [address]   Dear Ms. Russomanno,   According to your medical record, it is time for you to schedule a Colonoscopy. The American Cancer Society recommends this procedure as a method to detect early colon cancer. Patients with a family history of colon cancer, or a personal history of colon polyps or inflammatory bowel disease are at increased risk.  This letter has beeen generated based on the recommendations made at the time of your procedure. If you feel that in your particular situation this may no longer apply, please contact our office.  Please call our office at [phone] to schedule this appointment or to update your records at your earliest convenience.  Thank you for cooperating with [place] to provide you with the very best care possible.   Sincerely,  Hedwig Morton. Juanda Chance, M.D.  [facility] [phone]

## 2010-12-08 NOTE — Assessment & Plan Note (Signed)
Summary: f19m    Visit Type:  3 months follow up Primary Provider:  Lysbeth Galas, MD  CC:  Weakness.  History of Present Illness: She gets weak after she eats her breakfast, nearly on a daily basis.  She takes two medications at that time, thyroid hormone and beta blocker.  Denies chest pain.   Current Medications (verified): 1)  Oscal 500/200 D-3 500-200 Mg-Unit Tabs (Calcium-Vitamin D) .... Two Times A Day 2)  Levoxyl 75 Mcg Tabs (Levothyroxine Sodium) .... Once Daily 3)  Aspir-Low 81 Mg Tbec (Aspirin) .... Once Daily 4)  Metoprolol Tartrate 25 Mg Tabs (Metoprolol Tartrate) .... 1/2 Tab Two Times A Day 5)  Alendronate Sodium 70 Mg Tabs (Alendronate Sodium) .Marland Kitchen.. 1tab Once Weekly 6)  Fish Oil 1000 Mg Caps (Omega-3 Fatty Acids) .Marland Kitchen.. 1cap  Two Times A Day 7)  Icaps  Caps (Multiple Vitamins-Minerals) .Marland Kitchen.. 1 Cap Two Times A Day 8)  Citalopram Hydrobromide 10 Mg Tabs (Citalopram Hydrobromide) .... Take 1/2 Tablet Daily  Allergies (verified): No Known Drug Allergies  Vital Signs:  Patient profile:   77 year old female Height:      68 inches Weight:      156.25 pounds BMI:     23.84 Pulse rate:   54 / minute Pulse rhythm:   regular Resp:     18 per minute BP sitting:   107 / 64  (left arm) Cuff size:   large  Vitals Entered By: Vikki Ports (Mar 11, 2010 12:53 PM)  Physical Exam  General:  Well developed, well nourished, in no acute distress. Head:  normocephalic and atraumatic Eyes:  PERRLA/EOM intact; conjunctiva and lids normal. Lungs:  Clear bilaterally to auscultation and percussion. Heart:  No murmur or rub.  Normal S1 and S2.   Pulses:  pulses normal in all 4 extremities Extremities:  No clubbing or cyanosis. Neurologic:  Alert and oriented x 3.   EKG  Procedure date:  03/11/2010  Findings:      SB with PACs.    Impression & Recommendations:  Problem # 1:  ATRIAL FIBRILLATION, PAROXYSMAL, HX OF (ICD-V12.50) Doubt significant recurrence.  Not a warfarin candidate  because of chronic, significant thrombocytopenia.  Continue current meds Orders: EKG w/ Interpretation (93000)  Problem # 2:  THROMBOCYTOPENIA, CHRONIC (ICD-287.5) Followed in heme clinic.   Problem # 3:  WEAKNESS (ICD-780.79) seems to occur after meals.  therefore, will have her defer meds for a couple of hours after breakfast to see if it is related.  Also, instructed her take her pulse.  May need to hold metoprolol, although currently in very low dose.  Reviewed with patient in detail   Patient Instructions: 1)  Your physician recommends that you continue on your current medications as directed. Please refer to the Current Medication list given to you today. 2)  Your physician wants you to follow-up in:   6 MONTHS. You will receive a reminder letter in the mail two months in advance. If you don't receive a letter, please call our office to schedule the follow-up appointment.

## 2010-12-10 NOTE — Assessment & Plan Note (Signed)
Summary: consult for insomnia   Visit Type:  Initial Consult Copy to:  Shawnie Pons  Primary Provider/Referring Provider:  Lysbeth Galas  CC:  Sleep Consult. pt states she stays awake and tosses and turn. .  History of Present Illness: the pt is an 77y/o female who I have been asked to see for sleep issues.  The pt states since her retirement many years ago, she has had issues with sleep onset and maintenance insomnia.  She tries to go to bed around 11-41mn, and will get to sleep quickly on some nights and hours on other nights.  She typically will awaken frequently during the night, and may take hours to get back to sleep to not at all.  She will usually start her day no later than 9am.  She complains that her "mind begins to wander", and she will typically stay in bed and toss/turn.  She does not watch tv or read in bed.  She does note left shoulder pain that interferes with sleep, and sometimes has issues getting comfortable.  She is unsure if she snores, but denies gasping arousals.  She does not have convincing history for RLS, but may have some leg discomfort in the early evening.  She drinks one cup of coffee in the am's, and occasionally will have a cup of tea.  She will occasionally take naps during the day. Her epworth score is 11,  but she denies significant sleepiness during the day.  Current Medications (verified): 1)  Oscal 500/200 D-3 500-200 Mg-Unit Tabs (Calcium-Vitamin D) .... Two Times A Day 2)  Levoxyl 75 Mcg Tabs (Levothyroxine Sodium) .... Once Daily 3)  Aspir-Low 81 Mg Tbec (Aspirin) .... Once Daily 4)  Metoprolol Tartrate 25 Mg Tabs (Metoprolol Tartrate) .... 1/2 Tab Two Times A Day 5)  Fish Oil 1000 Mg Caps (Omega-3 Fatty Acids) .Marland Kitchen.. 1cap  Two Times A Day 6)  Icaps  Caps (Multiple Vitamins-Minerals) .Marland Kitchen.. 1 Cap Two Times A Day 7)  Citalopram Hydrobromide 10 Mg Tabs (Citalopram Hydrobromide) .... Take 1 Tablet By Mouth Once A Day  Allergies (verified): No Known Drug  Allergies  Past History:  Past Medical History:  ATRIAL FIBRILLATION, PAROXYSMAL, HX OF (ICD-V12.50) HYPERTRIGLYCERIDEMIA (ICD-272.1) PALPITATIONS (ICD-785.1) DYSPNEA (ICD-786.05) THROMBOCYTOPENIA, CHRONIC (ICD-287.5) HYPOTHYROIDISM (ICD-244.9) BREAST CANCER (ICD-174.9) OSTEOPOROSIS (ICD-733.00) MACULAR DEGENERATION (ICD-362.50)  Past Surgical History: .Right shoulder rotator cuff repair.  left Breast lumpectomy.   Arthroscopy     Family History: Reviewed history from 07/12/2009 and no changes required.   Mother died of complications of Parkinson's at 44.   Father died of prostate cancer.  She has one brother with prostate   cancer.   Social History: Reviewed history from 07/12/2009 and no changes required.  SOCIAL HISTORY:  The patient lives in [place] with her husband.  She is  retired Loss adjuster, chart[employer])  She has been married for 14 years.  She has  four children.  She has a 60 pack-year history, but has stopped smoking   since 1984. smoked 1 1/2 ppd. started age 32.  and has an occasional glass of wine, no drugs.  No exercise  program.   Review of Systems       The patient complains of shortness of breath with activity, loss of appetite, abdominal pain, difficulty swallowing, nasal congestion/difficulty breathing through nose, sneezing, itching, anxiety, and hand/feet swelling.  The patient denies shortness of breath at rest, productive cough, non-productive cough, coughing up blood, chest pain, irregular heartbeats, acid heartburn, indigestion, weight change, sore throat, tooth/dental  problems, headaches, ear ache, depression, joint stiffness or pain, rash, change in color of mucus, and fever.    Vital Signs:  Patient profile:   77 year old female Height:      68 inches Weight:      151.50 pounds O2 Sat:      97 % on Room air Temp:     97.7 degrees F oral Pulse rate:   56 / minute BP sitting:   100 / 60  (left arm) Cuff size:   regular  Vitals Entered By: Carver Fila  (October 09, 2010 11:04 AM)  O2 Flow:  Room air  Physical Exam  General:  wd female in nad Eyes:  PERRLA and EOMI.   Nose:  deviated septum to left with mild narrowing. o/w patent Mouth:  mild elongation of soft palate and uvula Neck:  no jvd, tmg, LN Lungs:  clear to auscultation, no wheezing or rhonchi Heart:  distant, but sounds regular.  No mrg Abdomen:  soft and nontender, bs+ Extremities:  minimal ankle edema, no cyanosis  pulses intact distally, but decreased Neurologic:  alert and oriented, moves all 4.   Impression & Recommendations:  Problem # 1:  PERSISTENT DISORDER INITIATING/MAINTAINING SLEEP (ICD-307.42) the pt is having issues with sleep onset and maintenance, but it is unclear if she has other sleep disorders such as OSA or a movement disorder of sleep.  MSK discomfort is probably contributing to this issue as well.  I have had a long discussion with her about insomnia, and that it is usually corrected with behavioral therapies and not medications.  I have reviewed good sleep hygiene with her, and also stimulus control therapy.  I have reminded her this has been going on for years, and that we are looking for very slow improvement over time.    Other Orders: Consultation Level V [id number])  Patient Instructions: 1)  do not go to bed before 11pm, and get up no later than 9am each day no matter how little sleep you have gotten 2)  no napping during the day, never read or watch tv while in bed. 3)  Do not stay in bed more than if you cannot fall asleep.Marland Kitchengo to family room to read or watch tv.  Go back to bed when you start getting sleepy.  Do this as many times as it takes until you fall asleep or 9am comes.  Appended Document: consult for insomnia the pt's sleep study shows no sleep apnea, but did show large numbers of leg kicks with sleep disruption.  will try her on requip for next 3 weeks to see if helps. please call in to [facility]:  requip 0.5 mg one after  dinner each night...#30, 91fill.  I have instructed pt to call me with update on her progress in 3 weeks.  Appended Document: consult for insomnia    Clinical Lists Changes  Medications: Added new medication of REQUIP 0.5 MG TABS (ROPINIROLE HCL) Take one by mouth after dinner each night - Signed Rx of REQUIP 0.5 MG TABS (ROPINIROLE HCL) Take one by mouth after dinner each night;  #30 x 1;  Signed;  Entered by: Abigail Miyamoto RN;  Authorized by: Barbaraann Share MD;  Method used: Telephoned to [facility], [phone], [place], [place], [place]  [address], Ph: [phone] or [phone], Fax: [phone]    Prescriptions: REQUIP 0.5 MG TABS (ROPINIROLE HCL) Take one by mouth after dinner each night  #30  x 1   Entered by:   Abigail Miyamoto RN   Authorized by:   Barbaraann Share MD   Signed by:   Abigail Miyamoto RN on 10/26/2010   Method used:   Telephoned to ...       [facility]       [address]       [place], [place]  [address]       Ph: [phone] or [phone]       Fax: [phone]   RxID:   [id number]

## 2011-01-20 ENCOUNTER — Ambulatory Visit: Payer: Self-pay | Admitting: Cardiology

## 2011-01-26 ENCOUNTER — Encounter: Payer: Self-pay | Admitting: Cardiology

## 2011-02-02 ENCOUNTER — Ambulatory Visit (INDEPENDENT_AMBULATORY_CARE_PROVIDER_SITE_OTHER): Payer: Medicare Other | Admitting: Cardiology

## 2011-02-02 ENCOUNTER — Encounter: Payer: Self-pay | Admitting: Cardiology

## 2011-02-02 DIAGNOSIS — R002 Palpitations: Secondary | ICD-10-CM

## 2011-02-02 DIAGNOSIS — Z8679 Personal history of other diseases of the circulatory system: Secondary | ICD-10-CM

## 2011-02-02 NOTE — Assessment & Plan Note (Signed)
Doing well overall.  No obvious recurrence.  See back in one year.

## 2011-02-02 NOTE — Assessment & Plan Note (Signed)
No symptoms 

## 2011-02-02 NOTE — Patient Instructions (Signed)
Your physician wants you to follow-up in: 1 YEAR.  You will receive a reminder letter in the mail two months in advance. If you don't receive a letter, please call our office to schedule the follow-up appointment.  Your physician recommends that you continue on your current medications as directed. Please refer to the Current Medication list given to you today.  

## 2011-02-02 NOTE — Progress Notes (Signed)
HPI:  Patient is doing pretty well.  She has a little bit of back pain.  No palpitations, and no clinical arrhythmia.   Current Outpatient Prescriptions  Medication Sig Dispense Refill  . aspirin 81 MG tablet Take 81 mg by mouth daily.        . calcium-vitamin D (OSCAL WITH D) 500-200 MG-UNIT per tablet Take 1 tablet by mouth daily.        . citalopram (CELEXA) 10 MG tablet Take 10 mg by mouth daily.        Marland Kitchen levothyroxine (SYNTHROID, LEVOTHROID) 75 MCG tablet Take 75 mcg by mouth daily.        . metoprolol tartrate (LOPRESSOR) 25 MG tablet Take 25 mg by mouth. Take 1/2 tablet two times daily       . Multiple Vitamins-Minerals (ICAPS MV PO) Take 1 capsule by mouth daily.        . fish oil-omega-3 fatty acids 1000 MG capsule Take 1 capsule by mouth 2 (two) times daily.        Marland Kitchen rOPINIRole (REQUIP) 0.5 MG tablet Take 0.5 mg by mouth. Take one by mouth after dinner each night         No Known Allergies  Past Medical History  Diagnosis Date  . Atrial fibrillation   . Hypertriglyceridemia   . Palpitations   . Dyspnea   . Thrombocytopenia, unspecified     chronic  . Hypothyroidism   . Breast cancer   . Osteoporosis   . Macular degeneration     Past Surgical History  Procedure Date  . Rotator cuff repair     right shoulder  . Breast lumpectomy     left breast  . Shoulder arthroscopy     No family history on file.  History   Social History  . Marital Status: Married    Spouse Name: N/A    Number of Children: N/A  . Years of Education: N/A   Occupational History  . Not on file.   Social History Main Topics  . Smoking status: Former Smoker -- 1.0 packs/day for 42 years    Types: Cigarettes    Quit date: 01/26/1983  . Smokeless tobacco: Never Used  . Alcohol Use: Not on file  . Drug Use: Not on file  . Sexually Active: Not on file   Other Topics Concern  . Not on file   Social History Narrative   Pt lives in [place] with her husband.  She is retired Loss adjuster, chartered)  She as been married for 14 years.  She has 4 children.  She has a 42-pack-year history, but has stopped smoking since 1984, started at age 11.  Pt occasional has a glass of wine, no drugs.  No exercise program.     ROS: Please see the HPI.  All other systems reviewed and negative.  PHYSICAL EXAM:  BP 120/65  Pulse 55  Resp 18  Ht 5\' 8"  (1.727 m)  Wt 149 lb 12.8 oz (67.949 kg)  BMI 22.78 kg/m2  General: Well developed, well nourished, in no acute distress. Head:  Normocephalic and atraumatic. Neck: no JVD Lungs: Clear to auscultation and percussion. Heart: Normal S1 and S2.  No murmur, rubs or gallops.  Abdomen:  Normal bowel sounds; soft; non tender; no organomegaly Pulses: Pulses normal in all 4 extremities. Extremities: No clubbing or cyanosis. No edema. Neurologic: Alert and oriented x 3.  EKG:NSR.  RSR prime consistent with RV conduction delay.   No acute abnormality.  ASSESSMENT AND PLAN:

## 2011-03-03 ENCOUNTER — Encounter (HOSPITAL_BASED_OUTPATIENT_CLINIC_OR_DEPARTMENT_OTHER): Payer: Medicare Other | Admitting: Hematology & Oncology

## 2011-03-03 ENCOUNTER — Other Ambulatory Visit: Payer: Self-pay | Admitting: Hematology & Oncology

## 2011-03-03 DIAGNOSIS — D4620 Refractory anemia with excess of blasts, unspecified: Secondary | ICD-10-CM

## 2011-03-03 DIAGNOSIS — D469 Myelodysplastic syndrome, unspecified: Secondary | ICD-10-CM

## 2011-03-03 DIAGNOSIS — C50319 Malignant neoplasm of lower-inner quadrant of unspecified female breast: Secondary | ICD-10-CM

## 2011-03-03 DIAGNOSIS — Z9889 Other specified postprocedural states: Secondary | ICD-10-CM

## 2011-03-03 LAB — CBC WITH DIFFERENTIAL (CANCER CENTER ONLY)
BASO#: 0 10*3/uL (ref 0.0–0.2)
Eosinophils Absolute: 0.1 10*3/uL (ref 0.0–0.5)
HGB: 12.7 g/dL (ref 11.6–15.9)
LYMPH#: 1 10*3/uL (ref 0.9–3.3)
MCH: 29.8 pg (ref 26.0–34.0)
MONO%: 13.9 % — ABNORMAL HIGH (ref 0.0–13.0)
NEUT#: 1.8 10*3/uL (ref 1.5–6.5)
Platelets: 111 10*3/uL — ABNORMAL LOW (ref 145–400)
RBC: 4.26 10*6/uL (ref 3.70–5.32)
WBC: 3.4 10*3/uL — ABNORMAL LOW (ref 3.9–10.0)

## 2011-03-03 LAB — COMPREHENSIVE METABOLIC PANEL
ALT: 18 U/L (ref 0–35)
AST: 25 U/L (ref 0–37)
Alkaline Phosphatase: 67 U/L (ref 39–117)
CO2: 23 mEq/L (ref 19–32)
Creatinine, Ser: 1.14 mg/dL (ref 0.40–1.20)
Sodium: 141 mEq/L (ref 135–145)
Total Bilirubin: 0.5 mg/dL (ref 0.3–1.2)
Total Protein: 6.9 g/dL (ref 6.0–8.3)

## 2011-03-03 LAB — RETICULOCYTES (CHCC)
ABS Retic: 47.2 10*3/uL (ref 19.0–186.0)
RBC.: 4.29 MIL/uL (ref 3.87–5.11)
Retic Ct Pct: 1.1 % (ref 0.4–3.1)

## 2011-03-03 LAB — CHCC SATELLITE - SMEAR

## 2011-03-09 ENCOUNTER — Other Ambulatory Visit: Payer: Self-pay | Admitting: Hematology & Oncology

## 2011-03-09 ENCOUNTER — Ambulatory Visit
Admission: RE | Admit: 2011-03-09 | Discharge: 2011-03-09 | Disposition: A | Discharge status: Home / Self Care | Payer: Medicare Other | Source: Ambulatory Visit | Attending: Hematology & Oncology | Admitting: Hematology & Oncology

## 2011-03-09 DIAGNOSIS — Z9889 Other specified postprocedural states: Secondary | ICD-10-CM

## 2011-03-10 ENCOUNTER — Other Ambulatory Visit: Payer: Self-pay | Admitting: Hematology & Oncology

## 2011-03-10 DIAGNOSIS — R928 Other abnormal and inconclusive findings on diagnostic imaging of breast: Secondary | ICD-10-CM

## 2011-03-12 ENCOUNTER — Ambulatory Visit (HOSPITAL_BASED_OUTPATIENT_CLINIC_OR_DEPARTMENT_OTHER)
Admission: RE | Admit: 2011-03-12 | Discharge: 2011-03-12 | Disposition: A | Discharge status: Home / Self Care | Payer: Medicare Other | Source: Ambulatory Visit | Attending: Hematology & Oncology | Admitting: Hematology & Oncology

## 2011-03-12 DIAGNOSIS — N200 Calculus of kidney: Secondary | ICD-10-CM

## 2011-03-12 DIAGNOSIS — R1013 Epigastric pain: Secondary | ICD-10-CM | POA: Insufficient documentation

## 2011-03-12 DIAGNOSIS — I700 Atherosclerosis of aorta: Secondary | ICD-10-CM | POA: Insufficient documentation

## 2011-03-12 DIAGNOSIS — Z853 Personal history of malignant neoplasm of breast: Secondary | ICD-10-CM | POA: Insufficient documentation

## 2011-03-15 ENCOUNTER — Ambulatory Visit
Admission: RE | Admit: 2011-03-15 | Discharge: 2011-03-15 | Disposition: A | Discharge status: Home / Self Care | Payer: Medicare Other | Source: Ambulatory Visit | Attending: Hematology & Oncology | Admitting: Hematology & Oncology

## 2011-03-15 DIAGNOSIS — R928 Other abnormal and inconclusive findings on diagnostic imaging of breast: Secondary | ICD-10-CM

## 2011-03-23 NOTE — Assessment & Plan Note (Signed)
[facility]                            CARDIOLOGY OFFICE NOTE   MCKELL, RIECKE                    MRN:          [id number]  DATE:03/18/2008                            DOB:          10-Dec-1924    Ms. Klecka is in for a followup visit.  In general, she is stable.  She  is not having any ongoing chest pain.  She has also not had syncope or  palpitations.  The patient had an episode of paroxysmal atrial  fibrillation.  She has chronic thrombocytopenia, and she has also been  on low-dose aspirin as a result.  We have been in agreement that  probably it is best to avoid Coumadin at this time.  She is scheduled to  have surgery on her eye tomorrow so they have actually had to have her  hold her aspirin.   EXAMINATION:  Blood pressure is 106/70, the pulse is 59.  The lung fields are clear.  The cardiac rhythm is regular.   EKG reveals sinus bradycardia.  There is an RV conduction delay and  nonspecific T-wave abnormality.   To basically review, she has seen Dr. Graciela Husbands in the past.  When I see her  back after her eye surgery, we will revisit the issue of whether or not  to put her on Coumadin.     Arturo Morton. Riley Kill, MD, [facility]  Electronically Signed    TDS/MedQ  DD: 03/18/2008  DT: 03/18/2008  Job #: [id number]

## 2011-03-23 NOTE — Assessment & Plan Note (Signed)
[facility]                            CARDIOLOGY OFFICE NOTE   NAME:HOPPERFemale, Iafrate                    MRN:          [id number]  DATE:12/30/2008                            DOB:          [birth year]    Ms. Durrett is in for a followup visit.  Clinically she is stable.  She  has not been having any ongoing chest pain.  Importantly, she does not  note that she has had any change in her rhythm, since I last saw her.  The patient has chronic thrombocytopenia.  The patient has had previous  episodes of falling, but she does have at least 2 risk factors for  thromboembolic events.  After a long discussion, we elected to treat her  with aspirin, as we are not convinced she is a good Coumadin candidate.   CURRENT MEDICATIONS:  1. Os-Cal.  2. Levoxyl 75 mcg daily.  3. ICAPS.  4. Aspirin 81 mg daily.  5. Metoprolol 25 mg one-half tablet p.o. b.i.d.  6. Alendronate 70 mg weekly.  7. Fish oil 1000 mg b.i.d.   On physical, blood pressure is 110/64, pulse is 66.  Lung fields are  actually clear and the cardiac exam is unchanged.   IMPRESSION:  1. History of prior paroxysmal atrial fibrillation with advanced age      and female gender.  2. Chronic thrombocytopenia.  3. History of falls.   PLAN:  Continue low-dose antiplatelet therapy and continue current  medical regimen.   ADDENDUM:  EKG reveals sinus bradycardia with RSR prime RV conduction  delay.     Arturo Morton. Riley Kill, MD, [facility]  Electronically Signed    TDS/MedQ  DD: 12/30/2008  DT: 12/31/2008  Job #: [id number]

## 2011-03-23 NOTE — Discharge Summary (Signed)
NAME:  [name] Hill, [name] Hill             ACCOUNT NO.:  [id number]   MEDICAL RECORD NO.:  [id number]          PATIENT TYPE:  INP   LOCATION:  1413                         FACILITY:  [facility]   PHYSICIAN:  Rollene Rotunda, MD, FACCDATE OF BIRTH:  11-Nov-1924   DATE OF ADMISSION:  10/20/2007  DATE OF DISCHARGE:  10/22/2007                               DISCHARGE SUMMARY   PRIMARY CARE PHYSICIAN:  Delaney Meigs, MD   CARDIOLOGIST:  Arturo Morton. Riley Kill, MD, [facility]   FINAL DIAGNOSES:  Syncope.   SECONDARY DIAGNOSIS:  1. Palpitations.  2. Dyspnea.  3. Hypothyroidism.  4. Thrombocytopenia.  5. Breast cancer (status post radiation therapy).  6. Osteoporosis.  7. Macular degeneration.  8. Hypertriglyceridemia.  9. Right shoulder rotator cuff repair.  10.Breast lumpectomy.   HISTORY OF PRESENT ILLNESS:  The patient was admitted on October 20, 2007 with palpitations.  She was also having some chest discomfort with  this.  It was a heavy discomfort.  She did feel some heart fluttering  and had a syncopal episode.  She was admitted to the hospital.   HOSPITAL COURSE:  The patient was observed for 48 hours.  Workup  included cardiac enzymes which were negative.  Basic metabolic profile  was normal, as described below.  She was not anemic.  Telemetry  demonstrated only normal sinus rhythm.  Did have a CT in the ER.  This  was of her head and spine.  There were no intracranial abnormalities.  There was small vessel disease.  She had spondylosis with disease of the  cervical spine.  Chest x-ray demonstrated emphysematous changes, but  otherwise was unremarkable.   On the morning of discharge the patient has had no complaints since  admission.  She has not had any palpitations.  She was not having any  presyncope or syncope.  She was not having any chest discomfort.   LABS:  Cholesterol 178, triglyceride 145, HDL 40, LDL 109, sodium 136,  potassium 4.3, chloride 103, BUN 24, creatinine 1.17, TSH  1.659, WBC  3.4, hemoglobin 12.6, platelets 78,000.   FOLLOWUP PLAN:  The plan is for an outpatient Adenosine nuclear study.  This will be arranged for early next week.  On that same day will apply  a 21-day CardioNet monitor.  We will then arrange followup with Dr.  Riley Kill.   The patient is notified that if she has recurrent chest discomfort or  similar symptoms, she should come back to the emergency room if this  happens prior to the above testing.   DISCHARGE MEDICATIONS:  1. Fosamax 70 mg weekly.  2. Os-Cal 500 mg daily.  3. Levoxyl 0.75 mg daily.  4. Multivitamins, ICaps.   Follow up as above.   Greater than 1/2 hour with this discharge.      Rollene Rotunda, MD, [facility]  Electronically Signed     JH/MEDQ  D:  10/22/2007  T:  10/23/2007  Job:  [id number]

## 2011-03-23 NOTE — Assessment & Plan Note (Signed)
[facility]                            CARDIOLOGY OFFICE NOTE   NAME:[name] Hill, [name] Hill                    MRN:          [id number]  DATE:12/08/2007                            DOB:          1925-02-19    [name] is in for a follow-up visit.  The patient's symptoms have  virtually resolved.  She is not having palpitations. I had her see Dr.  Graciela Husbands.  Her monitor demonstrated some episodes of what appear to be  paroxysmal atrial fibrillation.  She does not have ischemic symptoms.  She has chronic thrombocytopenia, and had had some presyncope.  There  was a problem however, with regard to placing the patient on Coumadin.  He suggested continuing low-dose beta blockade.  She seems to be doing  well on a fairly low-dose.  She does have increased thromboembolic risk,  but given everything, it was felt that we should hold off and treat her  with aspirin.   Today on examination the blood pressure was 102/64.  The pulse is 62.  The lung fields are clear.  The cardiac rhythm today is entirely  regular.   [name] appears to be improved.  We will continue on her current  medical regimen which includes both aspirin as well as beta  blockade.  She does not seem to be symptomatic at this time.  We will see her back  in follow-up in about 6 weeks.  We have kept the concept of potentially  considering Coumadin anticoagulation as part of the equation, but with  her chronic thrombocytopenia, we have been reluctant to do this.  We  will recheck her CBC.     Arturo Morton. Riley Kill, MD, [facility]  Electronically Signed    TDS/MedQ  DD: 12/08/2007  DT: 12/08/2007  Job #: [id number]

## 2011-03-23 NOTE — Assessment & Plan Note (Signed)
[facility]                            CARDIOLOGY OFFICE NOTE   NAME:HOPPERTrenise, [name]                    MRN:          [id number]  DATE:08/05/2008                            DOB:          02-04-1925    [name] Hill is in for followup.  In general, she has been stable.  She  thinks that the weak spells that she had previously have diminished  drastically.  She has not had palpitations or a racing heart.  She has  chronic thrombocytopenia, had previously had syncope, and despite some  atrial fibrillation, it was thought that she would be at least with  moderate high-risk for Coumadin.  However, she has at least two risk  factors for thromboembolic complications from this, so we have been  discussing this in a somewhat ongoing fashion.   MEDICATIONS:  1. Os-Cal daily.  2. Levoxyl 75 mcg daily.  3. ICaps daily.  4. Aspirin 81 mg daily.  5. Metoprolol 25 mg one-half tablet b.i.d.  6. Alendronate 70 mg weekly.  7. Fish oil b.i.d.   PHYSICAL EXAMINATION:  She is alert and oriented in no distress.  The  blood pressure is 102/64, the pulse is 53.  The lung fields are clear.  The cardiac exam is unchanged.   Electrocardiogram demonstrates sinus bradycardia.  There is incomplete  right bundle morphology.   IMPRESSION:  1. History of prior paroxysmal atrial fibrillation with advanced stage      and female gender.  2. Chronic thrombocytopenia.   PLAN:  1. We talked about the various options with regard to warfarin      anticoagulation and/or aspirin.  After thorough discussion of the      potential advantages and disadvantages, we elected to continue her      on aspirin.  2. She will return to clinic in 4 months in followup.     Arturo Morton. Riley Kill, MD, [facility]  Electronically Signed    TDS/MedQ  DD: 08/05/2008  DT: 08/06/2008  Job #: [id number]   cc:   Delaney Meigs, M.D.

## 2011-03-23 NOTE — H&P (Signed)
NAME:  [name] Hill, [name] Hill             ACCOUNT NO.:  [id number]   MEDICAL RECORD NO.:  [id number]          PATIENT TYPE:  EMS   LOCATION:  ED                           FACILITY:  [facility]   PHYSICIAN:  Arturo Morton. Riley Kill, MD, FACCDATE OF BIRTH:  Dec 23, 1924   DATE OF ADMISSION:  10/20/2007  DATE OF DISCHARGE:                              HISTORY & PHYSICAL   PRIMARY CARDIOLOGIST:  Dr. Shawnie Pons.   PRIMARY CARE PHYSICIAN:  Dr. Lysbeth Galas.   ONCOLOGIST:  Dr. Myna Hidalgo.   HISTORY OF PRESENT ILLNESS:  This is a very pleasant 77 year old  Caucasian female with no prior history of coronary artery disease with  complaints of palpitations and moderate mitral annular calcification per  echocardiogram.  The patient was having complaints of racing heart while  at home and a funny feeling in her chest, feeling very uncomfortable  with both arms feeling heavy and had near syncope.  This happened one  week prior to this admission.  Two days prior to this admission the  patient again felt palpitations, heaviness, and uncomfortable feeling in  her chest with heaviness in both arms and her heart fluttering, and this  time actually did pass out.  She hit the back of her head.  She awoke  almost immediately, and went and laid down.  She has had no more  symptoms since then, but she was talking with her family about it who  insisted she come to the emergency room.  She states that she has had  some intermittent chest discomfort radiating to the back.  She took some  baby aspirin, and it has been lasting longer gradually lengthening in  duration, and she will usually sit down and lie down for relief.  She  has not seen Dr. Riley Kill since 2006 at which time she has had a workup  for tachy palpitations, and did have a nonnuclear stress test which was  unable to be completed secondary to dyspnea.   REVIEW OF SYSTEMS:  Positive for chest discomfort, shortness of breath,  dyspnea on exertion, palpitations, and  syncopal episode; otherwise,  negative.   PAST MEDICAL HISTORY:  Chronic exertional dyspnea, palpitations, chronic  venous insufficiency, hypothyroidism, thrombocytopenia, breast cancer  with 10 radiation treatments being followed by Dr. Myna Hidalgo,  osteoporosis, macular degeneration, and hypertriglyceridemia.   PAST SURGICAL HISTORY:  Right shoulder rotator cuff repair and breast  lumpectomy in 2004.   SOCIAL HISTORY:  The patient lives in [place] with her husband.  She is  retired from __________  .  She has been married for 14 years.  She has  four children.  She has a 60 pack-year history, but has stopped smoking  since 1984 and has an occasional glass of wine, no drugs.  No exercise  program.   FAMILY HISTORY:  Mother died of complications of Parkinson's at 60.  Father died of prostate cancer.  She has one brother with prostate  cancer.   CURRENT MEDICATIONS:  1. Fosamax 70 mg once a week.  2. Os-Cal 500 mg daily.  3. Levoxyl 0.075 mg daily.  4. ICAP multivitamin daily.  ALLERGIES:  No known drug allergies.   CURRENT LABORATORIES:  Hemoglobin 13.3, hematocrit 38.1, white blood  cells 4, platelets 88.  Sodium 140, potassium 4.4, chloride 104, CO2 29,  BUN 23, creatinine 0.93, glucose 96.  Troponin 0.03, MB 1.8, CK 67.   CT head revealing no acute intracranial abnormalities.  CT spine:  Spondylosis and facet disease, no acute findings.  Chest x-ray is  pending.   PHYSICAL EXAMINATION:  CURRENT VITAL SIGNS:  Blood pressure 123/52,  pulse 76, respirations 60, temperature 97.1, O2 sat 96% on room air.  HEENT:  Head is normocephalic, atraumatic.  Eyes:  PERRLA, mucous  membranes and mouth pink and moist.  NECK:  At midline neck is supple.  There is no JVD or carotid bruits  appreciated.  CARDIOVASCULAR:  Regular rate and rhythm with soft 1/6 systolic murmur  auscultated.  Pulses are 2+ and equal bilaterally dorsalis pedis and  radial.  LUNGS:  Essentially clear to  auscultation without wheezes, rales or  rhonchi.  ABDOMEN:  Soft, nontender, 2+ bowel sounds.  EXTREMITIES:  Without clubbing, cyanosis or edema.  NEUROLOGICAL:  Cranial nerves II-XII are grossly intact.   IMPRESSION:  1. Syncopal episode.  2. Palpitations.  Rule out ventricular arrhythmias causing #1.  3. Chest discomfort.  4. Thrombocytopenia.  5. Hypothyroidism.   PLAN:  The patient has been seen and examined by Dr. Charlton Haws and  myself at [facility] emergency room; probable episodes of PSVT with  previous workup in 2006 negative.  Will admit to telemetry with 48 hour  observation and if no arrhythmias are seen the patient will be  discharged with Holter monitor to be worn as an outpatient and follow  with Dr. Riley Kill.  The patient verbalizes understanding of this plan and  agrees to be admitted.      Bettey Mare. Lyman Bishop, NP      Arturo Morton. Riley Kill, MD, [facility]  Electronically Signed    KML/MEDQ  D:  10/20/2007  T:  10/21/2007  Job:  [id number]   cc:   Delaney Meigs, M.D.  Fax: [phone]

## 2011-03-23 NOTE — Letter (Signed)
December 01, 2007    Arturo Morton. Riley Kill, MD, [facility]  [address]  [address]  [place], [place] [address]   RE:  BRYAN, OMURA  MRN:  [id number]  /  DOB:  Jan 17, 1925   Dear Elijah Birk:   It was a pleasure to see [place], at your request, because of  recurrent atrial fibrillation.   This apparently has been going on for a couple of years with paroxysms  of atrial fibrillation that have been quite rapid.  They have been  accompanied by weakness, lightheadedness, shortness of breath and  recently confirmed by your CardioNet monitor.   She had an episode of syncope in mid-December which was preceded by a  sensation of chest and arm discomfort which prior to that had always  been associated with tachy palpitations which frequently had been  accompanied by presyncope.  On this particular occasion she lost  consciousness and went down.  She was subsequently evaluated in  hospital, was not particularly notable for any significant bradycardia.  She was admitted for Myoview scanning which was normal contractility,  thickening and no perfusion abnormalities.   She does have mild left ventricular hypertrophy and mitral annular  calcification.   She does not have edema, nocturnal dyspnea or orthopnea.  She does not  have any abnormal chest discomfort.   She has had, however, problems with energy over the last year with  fatigue and sleepiness.   PAST MEDICAL HISTORY:  Noncontributory.   REVIEW OF SYSTEMS:  Notable for leg swelling, recurrent problems with  lower abdominal pain, breast cancer with radiation therapy,  thrombocytopenia, hypothyroidism, chronic venous insufficiency, macular  degeneration osteoporosis.   PAST SURGICAL HISTORY:  Notable for:  1. Right rotator cuff surgery.  2. Breast lumpectomy in 2004.   SOCIAL HISTORY:  1. She is retired.  2. She is remarried x14 years.  3. She has 4 children.  4. She does not use cigarettes, alcohol or recreational drugs.   CURRENT  MEDICATIONS:  1. Metoprolol succinate started by you.  2. Aspirin 81.  3. Levoxyl.  4. Fosamax.   She has no known drug allergies.   EXAMINATION:  She is an elderly Caucasian female appearing her stated  age of 67.  Her blood pressure is 106/61 with a pulse of 66, her weight  was 158.  HEENT EXAM:  No icterus or xanthoma.  NECK VEINS:  Flat.  CAROTIDS:  Brisk and full bilaterally without bruits.  BACK:  Without kyphosis or scoliosis.  LUNGS:  Clear.  HEART SOUNDS:  Regular without murmurs, there was an S4.  ABDOMEN:  Soft with active bowel sounds, without midline pulsation or  hepatomegaly.  Femoral pulses were 2+, distal pulses were intact.  There is no  clubbing, cyanosis or edema.  NEUROLOGICAL EXAM:  Grossly normal.   Electrocardiogram dated on 8 January demonstrated sinus rhythm at 71  with intervals of 0.14/0.09/0.4.  The axis was mildly leftward at -9.  It was otherwise normal apart from fairly flattened T-waves in the  anterior precordium and R-prime in lead V1.   The CardioNet monitor demonstrated recurrent episodes of atrial  fibrillation at various lengths, some lasting more than 10 to 20  minutes.  The rates with some of these episodes of atrial fibrillation  were really quite rapid with coupling intervals as short as 260  milliseconds and under 300 milliseconds being not all that uncommon.   IMPRESSION:  1. Atrial fibrillation with a rapid ventricular response -  symptomatic.  2. Syncope and presyncope associated with number 1.  3. Normal blood pressure.  4. No evidence of bradycardia.  5. Loss of energy over time.  6. Thrombocytopenia.  7. Thromboembolic risk factors notable for age/gender.   Elijah Birk, Ms. Harwick is feeling much better since you put her on the low-dose  metoprolol succinate.  The concerns that we share about her beta-blocker  therapy is whether she will end up with bradycardia and/or hypotension.  At this point, neither of them seems to  have been a problem and I think  your choice has turned out to pay off in spades.  If she has recurrent  symptoms, I would probably try and further up titrate her metoprolol.   As it relates to her anticoagulation thromboembolic risks, she clearly  meets criteria for anticoagulation therapy.  However, her syncopal  episode and her recurrent presyncope with her episodes of rapid atrial  fibrillation is daunting.  I would tend to agree with you to use aspirin  for right now, and if we are able to reduce her symptoms of presyncope  that are associated with her atrial fibrillation episodes, with enough  confidence, I would probably think about putting her on Coumadin at that  time.   She is to see you in about 1 to 2 weeks' time.  I have not arranged  followup with me.  If there is anything I can do further, please do not  hesitate to contact me.    Sincerely,      Duke Salvia, MD, [facility]  Electronically Signed    SCK/MedQ  DD: 12/01/2007  DT: 12/01/2007  Job #: [id number]   CC:    Delaney Meigs, M.D.

## 2011-03-23 NOTE — Assessment & Plan Note (Signed)
[facility]                            CARDIOLOGY OFFICE NOTE   NAME:HOPPERCloma, Rahrig                    MRN:          [id number]  DATE:11/16/2007                            DOB:          [birth year]    Ms. Defino is in for follow-up.  She was recently hospitalized at [facility].  She was admitted with palpitations and chest discomfort.  She was observed for about 48 hours.  She had basic metabolic profile  done, she had a head CT done, she had chest x-ray which showed  emphysematous change. She was subsequently put on a CardioNet monitor,  and this demonstrated some recurrent episodes of what appear to be  atrial fibrillation on the strips.  She can feel this fluttering, and  does get somewhat weak.  This has been going on for about 2 years.  Her  last echocardiogram was done in 2006, and at that time she had mitral  annular calcification.  The right ventricle was normal in size.  There  is a suggestion perhaps of mild right ventricular hypertrophy.  Of note,  the patient does have thrombocytopenia with platelet count of 78,000.   MEDICATIONS:  Include Fosamax 70 mg weekly, Os-Cal, Levoxyl,  and I  Caps.   PHYSICAL EXAMINATION:  She is alert and oriented in no distress. Blood  pressure is 110/62, the pulse is 71.  The lung fields actually are clear and the cardiac examination reveals a  normal rhythm without definite murmur.   The patient recently underwent a Myoview study.  This was done with  adenosine.  There was normal contractility and thickening in all areas  of myocardium with an ejection fraction of 86%.  She was thought to have  a normal stress test.   Her EKG today reveals a sinus rhythm with possible left atrial  enlargement, left axis deviation and incomplete right bundle.   IMPRESSION:  1. Advanced age.  2. Paroxysmal atrial fibrillation.  3. Long history of thrombocytopenia.   RECOMMENDATIONS:  1. The patient's  husband sees Dr. Graciela Husbands on a regular basis.  We will      get an EP consult as to the best strategy here.  2. I have elected not to start her on warfarin at the present time. We      will put her on a baby aspirin which she will take daily.  We will      also add low dose Toprol, to try to control the rate should she go      into this rhythm.   We may want to consider whether or not to an antiarrhythmic drug to keep  her in normal sinus rhythm.  She does not have identifiable coronary  disease at the present time.  This might be an alternative strategy  considering using warfarin anticoagulation.  I will have her discuss  this with Dr. Graciela Husbands and I will see her back shortly thereafter so we can  wrapped up recommendations and  plans. I have warned her about the metoprolol.  She is only going to be  taking 25  mg of days and should not cause a problem, but I told her it  could drop her blood pressure.     Arturo Morton. Riley Kill, MD, [facility]  Electronically Signed    TDS/MedQ  DD: 11/16/2007  DT: 11/16/2007  Job #: [id number]

## 2011-03-23 NOTE — Assessment & Plan Note (Signed)
[facility]                            CARDIOLOGY OFFICE NOTE   NAME:[name], Hill                    MRN:          [id number]  DATE:05/01/2008                            DOB:          [birth year]    [name] is in for followup.  She saw Dr. Dagoberto Ligas earlier today.  She tells me that they questioned whether or not she might need to have  a reoperation on her eye.  She apparently has had some cataract surgery,  but also sees a retinal specialist.  She also has problems with macular  degeneration.  In addition, she tells me that she has seen Dr. Linna Darner and  that her platelet counts were lower than he expected they would be.  Her  last one here was 82,000.  She does not have clinical bleeding.  Nonetheless, all of this has complicated the issue with regard to  whether or not to place her on Coumadin anticoagulation.  She has not  been having frequent or recurrent palpitations.   CURRENT MEDICATIONS:  1. Os-Cal daily.  2. Levoxyl 75 mcg daily.  3. I-Caps daily.  4. Zymar.  5. Aspirin 81 mg daily.  6. Metoprolol tartrate 25 mg one-half tablet b.i.d.  7. Alendronate 70 mg weekly.   PHYSICAL EXAMINATION:  GENERAL:  She is alert and oriented, in no  distress.  VITAL SIGNS:  Blood pressure is 102/70, the pulse is 58, and currently  regular.  LUNG:  Fields are clear.  HEART:  The cardiac rhythm today is regular.   Electrocardiogram demonstrates normal sinus rhythm.  There is an  occasional premature atrial contraction; otherwise, unremarkable.   IMPRESSION:  1. Prior paroxysmal atrial fibrillation in a patient with advanced age      and female gender.  2. Chronic thrombocytopenia.   PLAN:  She will continue on low-dose aspirin.  She may require some  additional retinal surgery.  I discussed with her and her husband the  issue of Coumadin anticoagulation today, and given the challenges here,  we have agreed that she will remain off Coumadin.   Fortunately, she is  not diabetic nor hypertensive.     Arturo Morton. Riley Kill, MD, [facility]  Electronically Signed    TDS/MedQ  DD: 05/01/2008  DT: 05/02/2008  Job #: [id number]

## 2011-03-26 NOTE — Op Note (Signed)
NAME:  [name] Hill, [name] Hill                       ACCOUNT NO.:  [id number]   MEDICAL RECORD NO.:  [id number]                   PATIENT TYPE:  AMB   LOCATION:  DSC                                  FACILITY:  [facility]   PHYSICIAN:  Currie Paris, M.D.           DATE OF BIRTH:  1925-06-10   DATE OF PROCEDURE:  09/19/2003  DATE OF DISCHARGE:                                 OPERATIVE REPORT   PREOPERATIVE DIAGNOSIS:  Left breast cancer, upper inner quadrant.   POSTOPERATIVE DIAGNOSIS:  Left breast cancer, upper inner quadrant.   OPERATION PERFORMED:  Needle guided excision, left breast cancer with blue  dye injection and axillary sentinel lymph node biopsy.   SURGEON:  Currie Paris, M.D.   ANESTHESIA:  General.   INDICATIONS FOR PROCEDURE:  The patient is Hill 77 year old with Hill recent  mammogram showing Hill density which on core biopsy showed invasive carcinoma  with mucinous features.  It was not palpable.  After discussing the  alternatives with the patient, we elected to proceed to needle guided  excisional biopsy excision with sentinel node evaluation.   DESCRIPTION OF PROCEDURE:  The patient was seen in the holding area and had  no further questions.  Hill guidewire entered medially and tracked laterally  entering almost at about the edge of the sternum and tracking towards the  nipple areolar complex.  She had already had her radioactive nuclide  injected.   The patient was taken to the operating room and after satisfactory general  anesthesia had been obtained, the breast was prepped and draped and 4mL of  methylene blue injected subareolarly.  This was massaged in for Hill few  minutes.  I began by making an elliptical incision starting at the guidewire  entry site and going  laterally and going towards the nipple.  I then raised  Hill little bit of Hill flap at the medial aspect of this incision, went down to  the chest wall.  I then began working with Hill full thickness excision  of  skin, subcutaneous tissue, breast tissue down to chest wall, going from  medial to lateral.  As I got more and more medial, I thought that we still  had further guidewire to go, so I ended up undermining Hill little bit past the  end of the skin incision.   As I got to this point, I could feel that the tumor was very close to the  superior lateral margin.  This was excised and sent for specimen  mammography.  I put Hill suture to mark the area where I thought the palpable  mass was.  I then went back to that exact area and took an additional  centimeter thick piece of margin to be sure I had an excellent margin at the  site where I thought we were close.  We did take fascia.  Everything  appeared to be dry and Hill pack was  left in place.  I then turned my attention  to the axilla and using NeoProbe, found Hill hot area.  I made Hill transverse  incision, divided the subcutaneous tissues, put Hill self-retaining retractor  in.  Hill little bit of dissection aided by the NeoProbe, I found Hill bluish  tinted note which was hot and I excised it with Hill cautery.  I had had counts  up to about1400.  Using the NeoProbe, I found two other nodes higher up,  both of which had counts of around 500, neither of which were blue.  Once  these were done, I saw no other hot areas, saw no blue nodes and palpated no  enlarged or adenopathy.  At this point, nodes were sent for Touch Preps and  I closed the incision in layers with 3-0 Vicryl followed by 4-0 Monocryl.  Attention was turned back to the lumpectomy site and it was closed with some  3-0 Vicryl to close the subcutaneous  to have Hill fairly thick skin subcu  closure in case we decided to use Hill MammoSite and then closed the skin with  4-0 Monocryl subcuticular.  Both incisions were covered with some Dermabond.  Pathology reported that the three sentinel nodes were negative. Mammography  reported that we had indeed retrieved the cancer.  The patient tolerated the   procedure well.  There were no operative complications.  All counts were  correct.                                               Currie Paris, M.D.    CJS/MEDQ  D:  09/19/2003  T:  09/20/2003  Job:  [id number]   cc:   Delaney Meigs, M.D.  [address]  [place]  [place] [address]  Fax: [phone]   [facility]

## 2011-03-26 NOTE — Discharge Summary (Signed)
NAME:  Seidel, Elizah A                       ACCOUNT NO.:  [id number]   MEDICAL RECORD NO.:  [id number]                   PATIENT TYPE:  INP   LOCATION:  4715                                 FACILITY:  [facility]   PHYSICIAN:  Lorne Skeens                         DATE OF BIRTH:  [birth year]   DATE OF ADMISSION:  07/02/2002  DATE OF DISCHARGE:  07/04/2002                                 DISCHARGE SUMMARY   DISCHARGE DIAGNOSES:  1. Urosepsis.  2. Hypertension.  3. Thrombocytopenia.  4. Hypothyroidism.  5. Osteoporosis.   DISCHARGE MEDICATIONS:  1. Fosamax 70 mg 1 p.o. weekly.  2. Os-Cal D 2 pills p.o. q.d.  3. Levoxyl 75 mcg 1 p.o. q.d.  4. Aspirin 81 mg 1 p.o. q.d.  5. Multivitamins 1 p.o. q.d.  6. Cipro 250 mg 1 p.o. b.i.d. x 7 days.   DISCHARGE INSTRUCTIONS:  She is told to call her primary care physician in  [place] when she gets home today to set up followup appointment for next  week.  Followup appointments include her primary care physician, Dr. Lysbeth Galas.  Encourage followup labs to monitor he thrombocytopenia and white blood cell  count.   HOSPITAL COURSE:  The patient was admitted on 07/02/2002 with chief complaint  of headache and weakness for one day prior to admission.  The patient was  also complaining of nausea, chest tightness, dysuria,chills,sweats,and  shortness of breath.  The patient had been seen in Dr. Joyce Copa office in  [place] on day of admission where patient was found to be orthostatic.  The  patient was admitted with a temperature of 101.2.  In the ED, her blood  pressure was 117/61,and her heart rate was 87.  The patient was treated with  IV fluids for orthostasis presumed to be from hypovolemia, and temperature  subsequently went down with antipyretics.  Urinalysis on admission showed a  cloudy yellow urine with specific gravity of 1022, pH 8.5, 30 protein, and  large leukocyte esterase. Microscopy had 3 to 6 white blood cells, many  bacteria, and  amorphous crystals.  The urine Gram stain was positive for  gram-negative rods, and urine culture is still pending as it was re-  incubated this morning.   The patient's chest x-ray showed no acute distress and was positive for COPD  changes.   A DIC panel was drawn after obtaining a CBC showing a white blood cell count  of 5.9 and a platelet count of 70.  It was noted that patient has a chronic  history of thrombocytopenia, but to rule out any coagulation disorders like  DIC, a panel was drawn which was negative.  The patient's PT, INR, and PTT  were all within normal limits.   The patient was ruled out for MI secondary to chest tightness, and all  cardiac enzymes were within normal range.  A TSH  was drawn as patient has a  history of hypothyroidism and is on Levoxyl.  TSH was 1.369.  During stay,  white blood cell count went from 5.9 to 3.1, platelet count went from 70 to  50.  The patient had no clinical signs of bleeding, and on discharge date,  the patient has no signs of orthostasis.  Blood pressure is 105/65, O2  saturation 97% on room air.  The patient is not tachycardic.  EKG was normal  sinus rhythm with a right bundle branch block in lead V1 which was also  present on admission.  The patient is afebrile, good p.o. intake, and denies  any dysuria.  The patient will be sent home with a 7-day course of p.o.  Cipro pending the urine culture results.  I will call her personally  tomorrow or Friday with urine culture results in case the sensitivity does  not match her Cipro.   CONSULTATIONS:  There were no consults.   LABORATORY DATA:  The only studies and results were a chest x-ray and EKG.  Pending results include a urine culture and blood culture which have been  negative as of 48 hours but will be held for five days.   Suggested followup items include platelet count.                                               Lorne Skeens    KL/MEDQ  D:  07/04/2002  T:  07/06/2002   Job:  [id number]   cc:   Delaney Meigs, M.D.

## 2011-03-26 NOTE — Op Note (Signed)
NAME:  [name] Hill, [name]             ACCOUNT NO.:  [id number]   MEDICAL RECORD NO.:  [id number]          PATIENT TYPE:  OUT   LOCATION:  OMED                         FACILITY:  [facility]   PHYSICIAN:  Rose Phi. Myna Hidalgo, M.D. DATE OF BIRTH:  1924/12/06   DATE OF PROCEDURE:  09/28/2005  DATE OF DISCHARGE:                                 OPERATIVE REPORT   NATURE OF PROCEDURE:  Left posterior iliac crest marrow biopsy and aspirate.   DESCRIPTION OF PROCEDURE:  Ms. Trautmann is brought to the short stay unit.  She had an IV placed into her right hand.  She was then placed onto her  right side.   She received Versed 2.5 mg IV and Demerol 25 mg IV for sedation.   The left posterior iliac crest region was prepped and draped in a sterile  fashion.  Lidocaine 9 mL was infiltrated under the skin down to the  periosteum.   A #11 scalpel was used to make an incision into the skin.  Two bone marrow  aspirates were obtained without difficulty.   A second incision was made into the skin with a #11 scalpel.  A good bone  marrow biopsy core was obtained without difficulty.   The patient tolerated the procedure well.  There were no complications.  There was no bleeding.      Rose Phi. Myna Hidalgo, M.D.  Electronically Signed     PRE/MEDQ  D:  09/28/2005  T:  09/28/2005  Job:  [id number]

## 2011-03-26 NOTE — Op Note (Signed)
NAME:  [name] Hill, [name] Hill                       ACCOUNT NO.:  [id number]   MEDICAL RECORD NO.:  [id number]                   PATIENT TYPE:  AMB   LOCATION:  DAY                                  FACILITY:  [facility]   PHYSICIAN:  Currie Paris, M.D.           DATE OF BIRTH:  1925/07/25   DATE OF PROCEDURE:  11/01/2003  DATE OF DISCHARGE:                                 OPERATIVE REPORT   CCS#:  [id number]   PREOPERATIVE DIAGNOSES:  Left breast cancer lower inner quadrant.   POSTOPERATIVE DIAGNOSES:  Left breast cancer lower inner quadrant.   OPERATION:  Placement of MammoSite left breast.   SURGEON:  Currie Paris, M.D.   ANESTHESIA:  General.   HISTORY:  The patient is Hill 77 year old whose elected to have MammoSite  radiation therapy for her left breast cancer.   DESCRIPTION OF PROCEDURE:  The patient was seen in the holding area and had  no further questions. She was taken to the operating room and after  satisfactory general (LMA) anesthesia was obtained, the breast was prepped  and draped. The ultrasound was used and there was Hill residual cavity present.  It was somewhat small and collapsed so I elected to make Hill small incision at  the lateral aspect of the scar and I was able to get Hill Kelly clamp into the  cavity and then my finger to make sure there were no loculations and  stranding.  I initially put the MammoSite catheter in through there but it  did not look like it was going to fit well and would be fairly close to the  skin so I made an incision inferiorly and used the trocar to make Hill tract  into the cavity site.  The MammoSite was pulled through that and initially  positioned and it took Hill few attempts to get this positioned correctly  because of the angle but once we got it correct, blew it up.  I then closed  the skin over the lateral aspect where I had opened the skin and used the  ultrasound but unfortunately we had too close of Hill skin margin especially  medially.  I then opened the incision more, replaced it and closed the  incision Hill second time and again we still had problems with depth although  we had good conformance to the cavity. I then opened the cavity in its  entirety, repositioned the MammoSite so that it entered the cavity Hill little  bit more in the middle of the cavity instead of the lateral end and got the  balloon positioned and then closed in two layers with Vicryl the cavity and  the subcu over the MammoSite catheter.  When it was inflated this time, we  had good conformance and well over Hill centimeter of cavity to skin margin  with 35 mL  in it. I then went ahead and closed the skin with Hill running 3-0 Prolene.  Hill  final ultrasound was done and everything looked appropriate. Sterile  dressings were applied.  The patient tolerated the procedure well. There  were no operative complications. All counts were correct.                                               Currie Paris, M.D.    CJS/MEDQ  D:  11/01/2003  T:  11/01/2003  Job:  [id number]   cc:   Delaney Meigs, M.D.  [address]  [place]  [place] [address]  Fax: [phone]   Artist Pais. Kathrynn Running, M.D.  [address] [facility]  [place]  [place] [address]  Fax: [phone]

## 2011-03-26 NOTE — Op Note (Signed)
NAME:  [name] Hill, [name] Hill             ACCOUNT NO.:  [id number]   MEDICAL RECORD NO.:  [id number]          PATIENT TYPE:  AMB   LOCATION:  DSC                          FACILITY:  [facility]   PHYSICIAN:  Loreta Ave, M.D. DATE OF BIRTH:  Jan 12, 1925   DATE OF PROCEDURE:  03/11/2005  DATE OF DISCHARGE:  03/11/2005                                 OPERATIVE REPORT   PREOPERATIVE DIAGNOSES:  1.  Chronic impingement of right shoulder with complete rotator cuff tear.  2.  Distal clavicle osteolysis.  3.  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   POSTOPERATIVE DIAGNOSES:  1.  Chronic impingement of right shoulder with complete rotator cuff tear.  2.  Distal clavicle osteolysis.  3.  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  4.  Underlying osteoporosis.  5.  Some grade 2 changes, glenohumeral joint, with tearing, anterior labrum.   PROCEDURES:  1.  Exam under anesthesia.  2.  Arthroscopy.  3.  Debridement of labrum and rotator cuff.  4.  Acromioplasty.  5.  CA ligament release.  6.  Excision, distal clavicle.  7.  Mini open repair of rotator cuff tear with bridge suture technique with      two bioabsorbable corkscrew anchors, FiberWire, and two lateral Nautilus      anchors countersunk.   ASSISTANT:  Genene Churn. Denton Meek.   ANESTHESIA:  General.   BLOOD LOSS:  Minimal.   SPECIMENS:  None.   CULTURES:  None.   COMPLICATIONS:  None.   DRESSINGS:  Soft compressive with shoulder immobilizer.   DESCRIPTION OF PROCEDURE:  The patient was brought to the operating room,  placed on the operating room table in supine position.  After adequate  anesthesia had been obtained, right shoulder was examined.  Some adhesions,  but certainly not dramatic.  No instability.  Placed in beach chair position  on a shoulder positioner, prepped and draped in usual sterile fashion.  Three standard portals,anterior, posterior lateral.  Shoulder entered,  distended and inspected.  Complete  rotator cuff tear, supraspinatus tendon,  retracted about a centimeter, but still very mobile and able to be brought  over the attachment site fairly easily.  Long head biceps tendon absent.  Some degenerative tearing of labrum debrided.  Some grade 2 changes in the  shoulder debrided.  Otherwise, capsular ligament structures, infraspinatus  and subscapularis tendons were intact.  Cannula redirected subacromially.  Type 2 acromion.  Bursa resected.  Acromioplasty shows hyperacromion and  shaved with high-speed bur.  CA ligament release accordingly.  Grade 3 and 4  changes, AC joint, lateral one centimeter and periarticular spurs resected.  Adequacy of decompression confirmed via small portals.  Instruments and  fluid removed.  Deltoid split incision laterally.  Cuff was mobilized.  Two  bioabsorbable corkscrew anchors placed at the medial attachment of the cuff  and the humerus.  Sutures from that were then weaved in the cuff medial to  the lateral attachment.  When that was completed, I have nice capturing of  the cuff, but the cuff was pulled over laterally and the two sutures were  then tied overtop of the anchor, securing the medial repair of the cuff.  Sutures were then brought overtop of the lateral margin of the cuff, down to  the lateral cortex of the humerus.  Two drill holes were made and two  nautilus bioabsorbable anchors were placed with the sutures weaved through  those anchors in a crossed manner to complete the bridge suture technique.  Noted to be very osteopenic, but I still had good capturing of the  bioabsorbable anchors above and very reasonable capturing of the Nautilus  anchors laterally.  At completion, nice firm watertight closure with good  sealing of the cuff, not only at the corkscrew anchors but bringing the cuff  down over a large footprint with the lateral nautilus anchors.  All  excessive suture removed.  Adequacy of decompression confirmed digitally at  the  time of cuff repair.  Wound irrigated.  The deltoid then closed with  Vicryl, skin and subcutaneous tissue with Vicryl and portals closed with  nylon.  Sterile compressive dressing applied after the margins of the wound  had been injected with Marcaine.  Shoulder immobilizer applied.  Anesthesia  reversed.  Brought to recovery room.  Tolerated surgery well.  No  complications.      DFM/MEDQ  D:  03/15/2005  T:  03/15/2005  Job:  [id number]

## 2011-06-24 ENCOUNTER — Other Ambulatory Visit: Payer: Self-pay | Admitting: Cardiology

## 2011-07-21 ENCOUNTER — Other Ambulatory Visit: Payer: Self-pay | Admitting: Hematology & Oncology

## 2011-07-21 ENCOUNTER — Encounter (HOSPITAL_BASED_OUTPATIENT_CLINIC_OR_DEPARTMENT_OTHER): Payer: Medicare Other | Admitting: Hematology & Oncology

## 2011-07-21 DIAGNOSIS — D4620 Refractory anemia with excess of blasts, unspecified: Secondary | ICD-10-CM

## 2011-07-21 DIAGNOSIS — E559 Vitamin D deficiency, unspecified: Secondary | ICD-10-CM

## 2011-07-21 DIAGNOSIS — Z853 Personal history of malignant neoplasm of breast: Secondary | ICD-10-CM

## 2011-07-21 LAB — CBC WITH DIFFERENTIAL (CANCER CENTER ONLY)
BASO#: 0 10*3/uL (ref 0.0–0.2)
BASO%: 0.7 % (ref 0.0–2.0)
HCT: 36.9 % (ref 34.8–46.6)
HGB: 12.7 g/dL (ref 11.6–15.9)
LYMPH#: 0.9 10*3/uL (ref 0.9–3.3)
LYMPH%: 29.8 % (ref 14.0–48.0)
MCV: 89 fL (ref 81–101)
MONO#: 0.4 10*3/uL (ref 0.1–0.9)
NEUT%: 53.3 % (ref 39.6–80.0)
RDW: 13.4 % (ref 11.1–15.7)
WBC: 3 10*3/uL — ABNORMAL LOW (ref 3.9–10.0)

## 2011-07-22 LAB — COMPREHENSIVE METABOLIC PANEL
AST: 24 U/L (ref 0–37)
Alkaline Phosphatase: 84 U/L (ref 39–117)
BUN: 26 mg/dL — ABNORMAL HIGH (ref 6–23)
Creatinine, Ser: 0.94 mg/dL (ref 0.50–1.10)
Total Bilirubin: 0.4 mg/dL (ref 0.3–1.2)

## 2011-07-22 LAB — RETICULOCYTES (CHCC): ABS Retic: 38.3 10*3/uL (ref 19.0–186.0)

## 2011-08-16 LAB — BASIC METABOLIC PANEL
CO2: 28
Calcium: 9.3
Chloride: 103
Chloride: 104
Creatinine, Ser: 0.93
GFR calc Af Amer: 54 — ABNORMAL LOW
GFR calc Af Amer: >60
Potassium: 4.4
Sodium: 136

## 2011-08-16 LAB — LIPID PANEL
HDL: 40
LDL Cholesterol: 109 — ABNORMAL HIGH
Total CHOL/HDL Ratio: 4.5
VLDL: 29

## 2011-08-16 LAB — CBC
Hemoglobin: 12.6
MCHC: 35
MCHC: 35.1
MCV: 88.1
RBC: 4.09
RBC: 4.37
RDW: 14.1

## 2011-08-16 LAB — CARDIAC PANEL(CRET KIN+CKTOT+MB+TROPI): Total CK: 25

## 2011-08-16 LAB — URINALYSIS, ROUTINE W REFLEX MICROSCOPIC
Glucose, UA: NEGATIVE
pH: 7

## 2011-08-16 LAB — DIFFERENTIAL
Basophils Absolute: 0
Basophils Relative: 0
Eosinophils Relative: 4
Monocytes Absolute: 0.4

## 2011-08-16 LAB — CK TOTAL AND CKMB (NOT AT ARMC)
Relative Index: INVALID
Total CK: 67

## 2011-08-24 IMAGING — US US ABDOMEN COMPLETE
1 series · 14 of 25 positions shown · non-contrast
Comparison: None.

CLINICAL DATA: History of breast cancer.  Epigastric pain after
eating.

COMPLETE ABDOMINAL ULTRASOUND

[Series 1: us abdomen complete · 0.26mm/px · 14 of 81 slices shown]
[im 1/81]
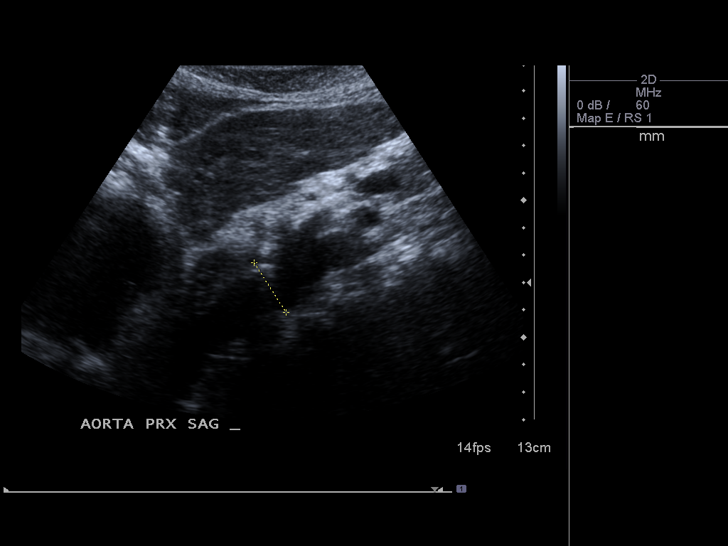
[im 7/81]
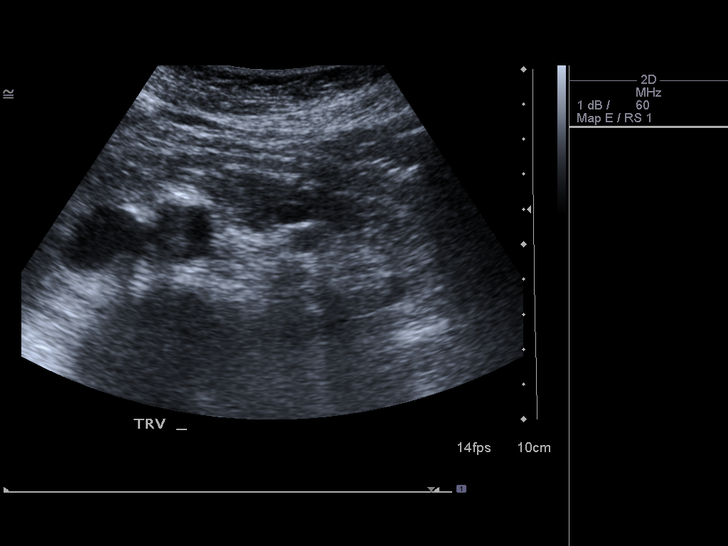
[im 14/81]
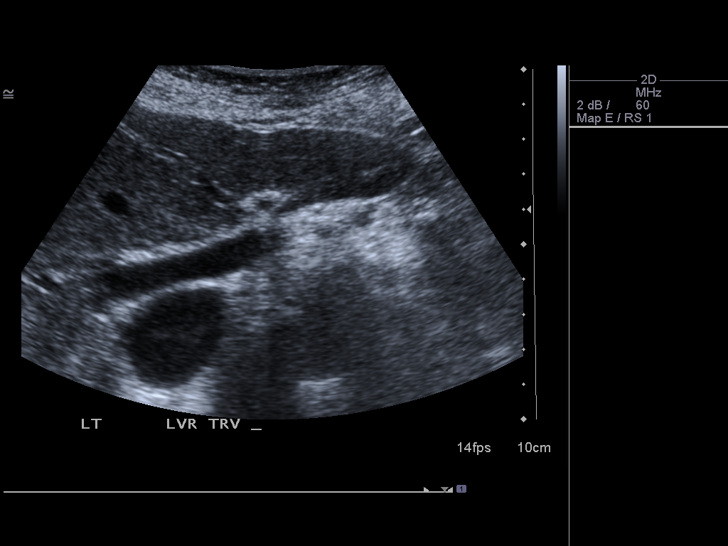
[im 21/81]
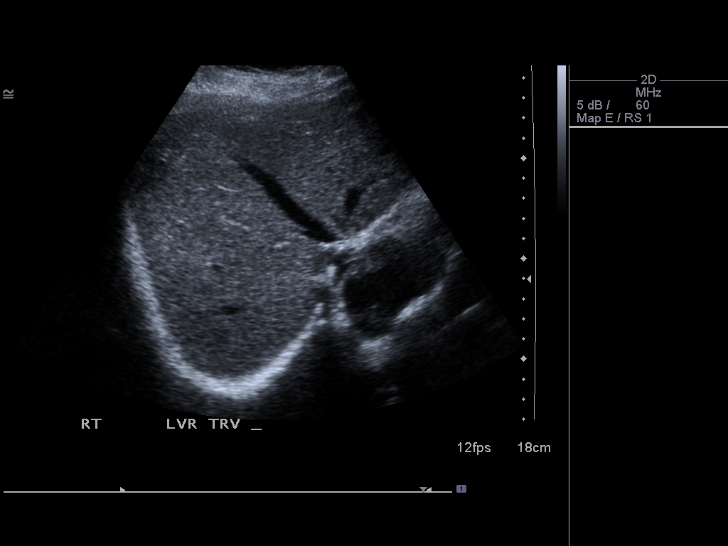
[im 27/81]
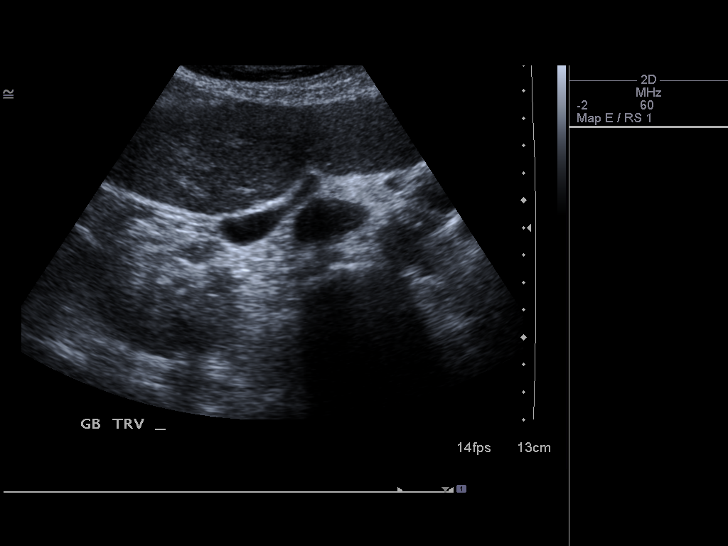
[im 31/81]
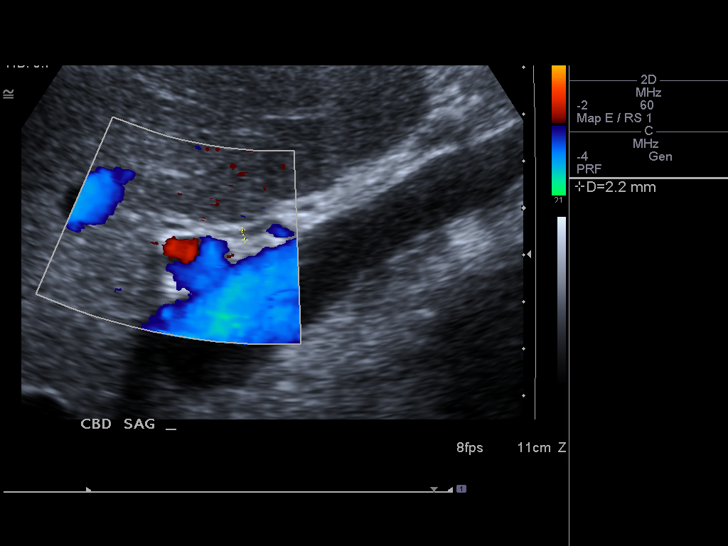
[im 37/81]
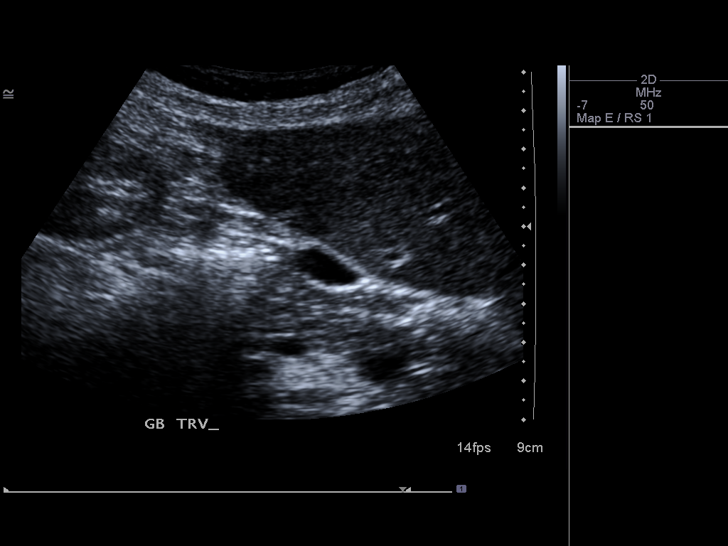
[im 44/81]
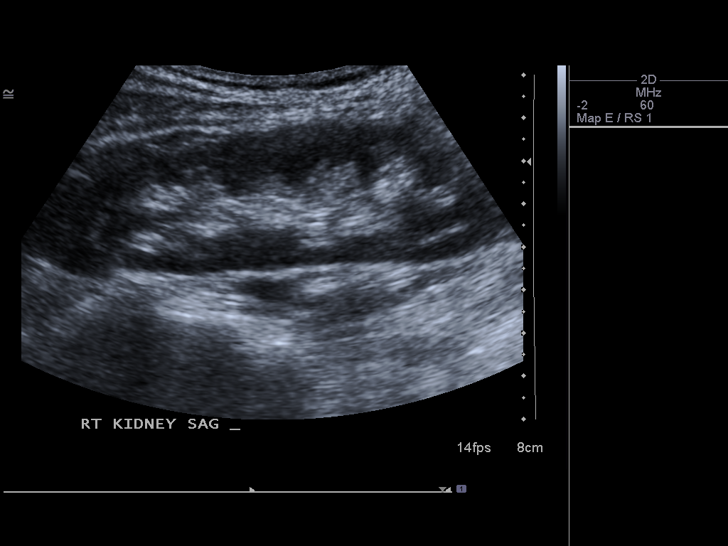
[im 51/81]
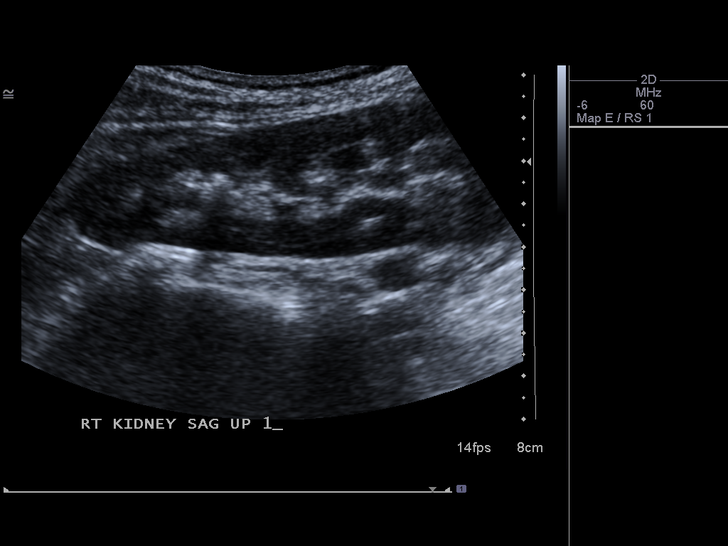
[im 54/81]
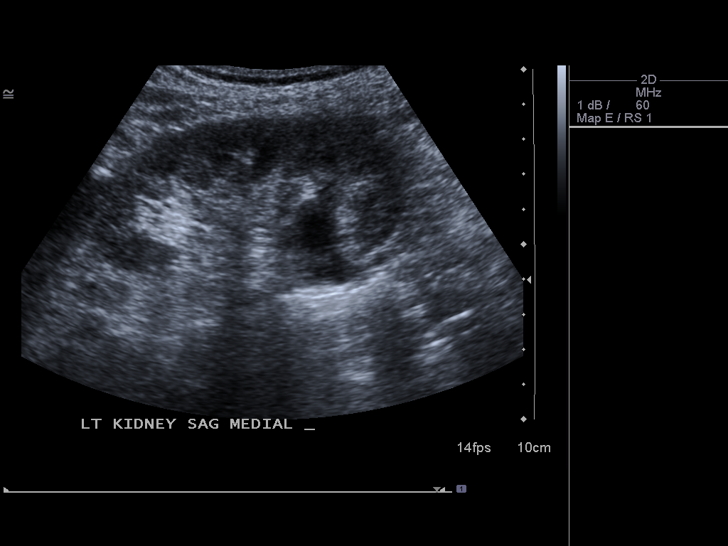
[im 61/81]
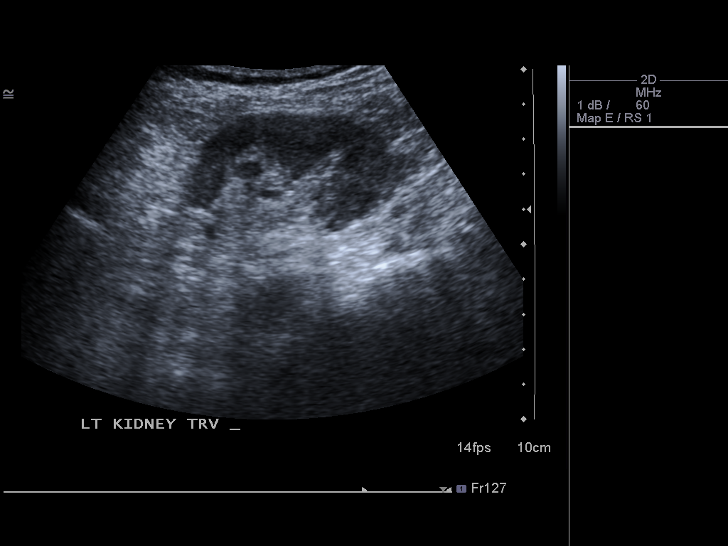
[im 67/81]
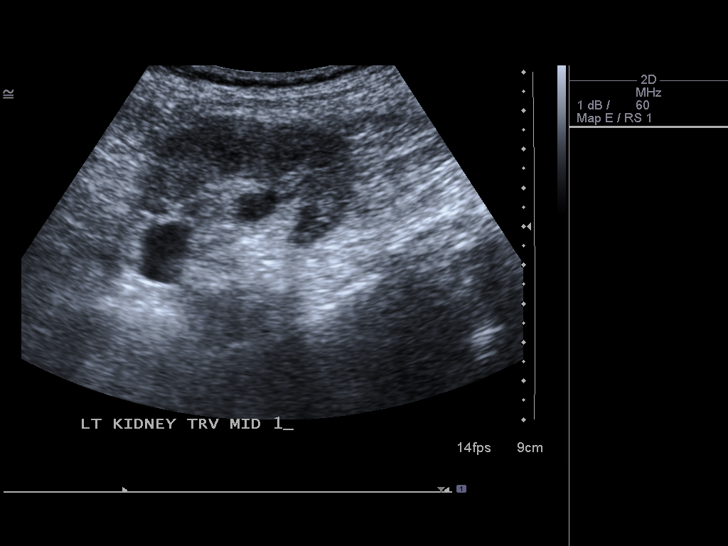
[im 74/81]
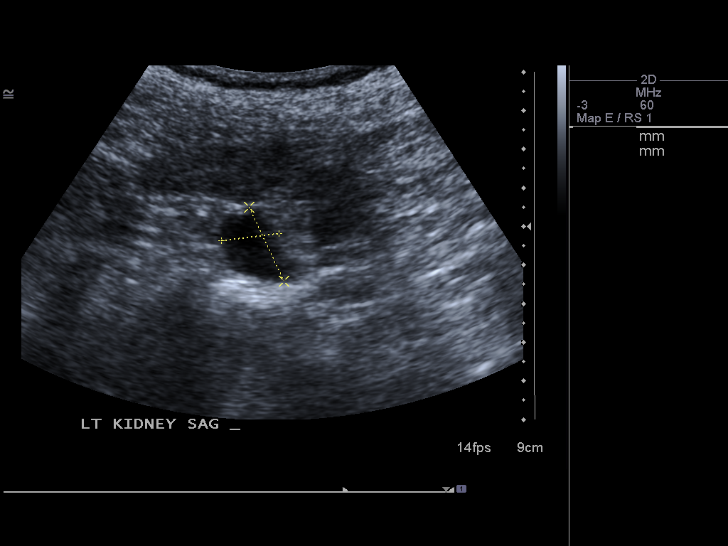
[im 81/81]
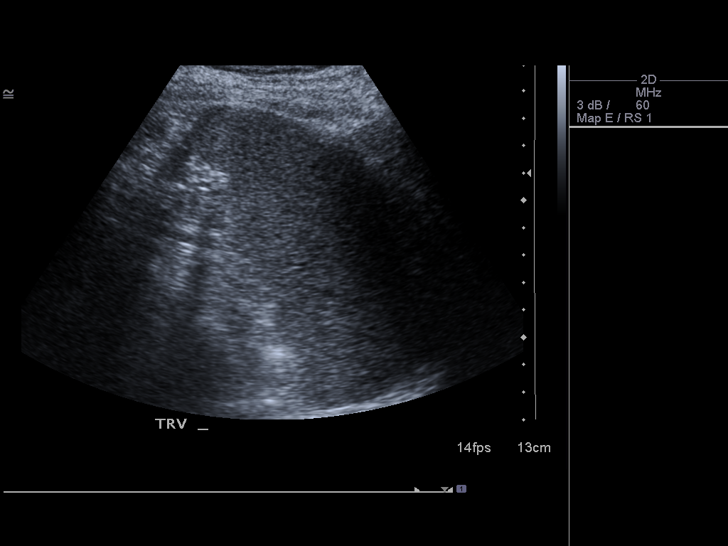

[14 of 25 positions shown; findings below may reference images not displayed]

FINDINGS: Gallbladder:  No gallstones, gallbladder wall thickening, or
pericholecystic fluid.

Common bile duct:  2.2 mm.

Liver:  Lobular contour.  No focal liver mass detected.

IVC:  Limited evaluation secondary to bowel gas.

Pancreas:  Limit evaluation secondary to bowel gas.

Spleen:  11.1 cm.  No focal mass.

Right Kidney:  10.6 cm.  No hydronephrosis.  Right upper pole
mm stone.

Left Kidney:  10.5 cm.  No hydronephrosis.  Parapelvic cyst
[REDACTED]5 cm.Extrarenal pelvis versus 2.5 cm left parapelvic
cyst.

Abdominal aorta:  Atherosclerotic type changes.  Prominent
calcified plaque.  Maximal AP dimension 2.2 cm.
IMPRESSION: No gallstones.

Slightly lobulated contour of the liver without focal lesion.

Nonobstructing right upper pole 7 mm stone.

Left renal parapelvic cysts.  Limited evaluation of the inferior
vena cava and pancreas secondary to overlying bowel gas.

Calcified plaque with atherosclerotic type changes of the aorta
with maximal AP dimension of 2.2 cm.

## 2011-12-22 ENCOUNTER — Ambulatory Visit (HOSPITAL_BASED_OUTPATIENT_CLINIC_OR_DEPARTMENT_OTHER): Payer: Medicare HMO | Admitting: Hematology & Oncology

## 2011-12-22 ENCOUNTER — Other Ambulatory Visit (HOSPITAL_BASED_OUTPATIENT_CLINIC_OR_DEPARTMENT_OTHER): Payer: PRIVATE HEALTH INSURANCE | Admitting: Lab

## 2011-12-22 VITALS — BP 110/72 | HR 53 | Temp 96.8°F | Ht 68.0 in | Wt 153.0 lb

## 2011-12-22 DIAGNOSIS — D4620 Refractory anemia with excess of blasts, unspecified: Secondary | ICD-10-CM

## 2011-12-22 DIAGNOSIS — D464 Refractory anemia, unspecified: Secondary | ICD-10-CM | POA: Insufficient documentation

## 2011-12-22 LAB — CBC WITH DIFFERENTIAL (CANCER CENTER ONLY)
BASO#: 0 10*3/uL (ref 0.0–0.2)
EOS%: 3.4 % (ref 0.0–7.0)
HGB: 12.6 g/dL (ref 11.6–15.9)
LYMPH%: 32 % (ref 14.0–48.0)
MCH: 30.4 pg (ref 26.0–34.0)
MCHC: 33.2 g/dL (ref 32.0–36.0)
MCV: 92 fL (ref 81–101)
MONO%: 16.1 % — ABNORMAL HIGH (ref 0.0–13.0)
NEUT#: 1.7 10*3/uL (ref 1.5–6.5)
NEUT%: 48.2 % (ref 39.6–80.0)

## 2011-12-22 LAB — COMPREHENSIVE METABOLIC PANEL
AST: 25 U/L (ref 0–37)
Alkaline Phosphatase: 77 U/L (ref 39–117)
BUN: 26 mg/dL — ABNORMAL HIGH (ref 6–23)
Creatinine, Ser: 1.01 mg/dL (ref 0.50–1.10)
Potassium: 4.4 mEq/L (ref 3.5–5.3)
Total Bilirubin: 0.4 mg/dL (ref 0.3–1.2)

## 2011-12-22 NOTE — Progress Notes (Signed)
This office note has been dictated.

## 2011-12-23 NOTE — Progress Notes (Signed)
CC:   Delaney Meigs, M.D.  DIAGNOSES: 1. Refractory cytopenia with multilineage dysplasia. 2. 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  CURRENT THERAPY:  Observation.  INTERIM HISTORY:  Ms. Ambrose comes in for follow-up.  Unfortunately, she lost her husband in October.  Thankfully, she has a good family, and so she has gotten through this.  Her last mammogram of note was back in May 2012.  Everything looked okay on the mammogram.  She has had no problems with bleeding or bruising.  She has had no fever.  She has had a decent appetite.  Again, she has been through a lot since October with her husband passing.  She has had no change in her medications.  She has had no fever.  She unfortunately does have macular degeneration with the right eye.  She is getting injections into the eye.  PHYSICAL EXAMINATION:  General Appearance:  This is an elderly white female in no obvious distress.  Vital Signs:  Show a temperature of 96.8, pulse 53, respiratory rate 16, blood pressure 110/72.  Weight is 153.  Head and Neck Exam:  Shows a normocephalic, atraumatic skull. There are no ocular or oral lesions.  There are no palpable cervical or supraclavicular lymph nodes.  Lungs:  Clear bilaterally.  Cardiac Exam: Regular rate and rhythm with a normal S1 and S2.  There are no murmurs, rubs or bruits.  Abdominal Exam:  Soft with good bowel sounds.  There is no palpable abdominal mass.  There is no fluid wave.  There is no palpable hepatosplenomegaly.  Back Exam:  No tenderness over the spine, ribs or hips.  Extremities:  Show no clubbing, cyanosis or edema. Neurological Exam:  Shows no focal neurological deficits.  LABORATORY STUDIES:  White cell count is 3.5, hemoglobin 12.6, hematocrit 37.9, and platelet count 75,000.  White cell differential shows 48 segs, 32 lymphocytes, 16 monocytes.  IMPRESSION:  Ms. Kang is an 78 year old white female with myelodysplasia.   She is holding her own.  Her blood counts have been very stable.  I think we can probably get her through the spring and summer now.  I want to try to get her back after Labor Day.  She says that she actually may move to [place].  She says her son lives down in [place].  Another son lives in [place], and she is from [place], so she wants to try to stay up here.  We will pray hard for Ms. Hipp regarding her husband's passing.    ______________________________ Josph Macho, M.D. PRE/MEDQ  D:  12/22/2011  T:  12/23/2011  Job:  [id number]

## 2012-02-03 ENCOUNTER — Ambulatory Visit: Payer: Medicare Other | Admitting: Cardiology

## 2012-02-23 ENCOUNTER — Encounter: Payer: Self-pay | Admitting: Cardiology

## 2012-02-23 ENCOUNTER — Ambulatory Visit (INDEPENDENT_AMBULATORY_CARE_PROVIDER_SITE_OTHER): Payer: Medicare HMO | Admitting: Cardiology

## 2012-02-23 VITALS — BP 112/76 | HR 49 | Ht 68.0 in | Wt 153.4 lb

## 2012-02-23 DIAGNOSIS — R001 Bradycardia, unspecified: Secondary | ICD-10-CM

## 2012-02-23 DIAGNOSIS — D696 Thrombocytopenia, unspecified: Secondary | ICD-10-CM

## 2012-02-23 DIAGNOSIS — I498 Other specified cardiac arrhythmias: Secondary | ICD-10-CM

## 2012-02-23 DIAGNOSIS — R002 Palpitations: Secondary | ICD-10-CM

## 2012-02-23 DIAGNOSIS — Z8679 Personal history of other diseases of the circulatory system: Secondary | ICD-10-CM

## 2012-02-23 NOTE — Patient Instructions (Signed)
Your physician recommends that you schedule a follow-up appointment as needed.   Your physician recommends that you continue on your current medications as directed. Please refer to the Current Medication list given to you today.  

## 2012-02-27 NOTE — Progress Notes (Signed)
HPI:  The patient is in for follow up.  Much of the visit was spent on deciding between staying here or moving to [place] with her son.  The latter would be in a form of assisted living arrangement, the former in a single house by herself.  The patient is undecided, and her two sons do not seem to be on the same page with regard to the best approach to this.  She has not had specific issues, runaway palpitations, or clinical recurrence of an arrythmia.  We also discussed her husband, a long standing patient of mine.  No chest pain reported.    Current Outpatient Prescriptions  Medication Sig Dispense Refill  . aspirin 81 MG tablet Take 81 mg by mouth daily.        . calcium-vitamin D (OSCAL WITH D) 500-200 MG-UNIT per tablet Take 1 tablet by mouth daily.        . citalopram (CELEXA) 10 MG tablet Take 10 mg by mouth daily.       Marland Kitchen donepezil (ARICEPT) 5 MG tablet Take 5 mg by mouth at bedtime.      Marland Kitchen levothyroxine (SYNTHROID, LEVOTHROID) 75 MCG tablet Take 75 mcg by mouth daily.        . metoprolol tartrate (LOPRESSOR) 25 MG tablet TAKE (1/2) TABLET TWICE DAILY.  30 tablet  11  . Multiple Vitamins-Minerals (ICAPS MV PO) Take 1 capsule by mouth daily.          No Known Allergies  Past Medical History  Diagnosis Date  . Atrial fibrillation   . Hypertriglyceridemia   . Palpitations   . Dyspnea   . Thrombocytopenia, unspecified     chronic  . Hypothyroidism   . Breast cancer   . Osteoporosis   . Macular degeneration     Past Surgical History  Procedure Date  . Rotator cuff repair     right shoulder  . Breast lumpectomy     left breast  . Shoulder arthroscopy     No family history on file.  History   Social History  . Marital Status: Married    Spouse Name: N/A    Number of Children: N/A  . Years of Education: N/A   Occupational History  . Not on file.   Social History Main Topics  . Smoking status: Former Smoker -- 1.0 packs/day for 42 years    Types: Cigarettes   Quit date: 01/26/1983  . Smokeless tobacco: Never Used  . Alcohol Use: Not on file  . Drug Use: Not on file  . Sexually Active: Not on file   Other Topics Concern  . Not on file   Social History Narrative   Pt lives in [place] with her husband.  She is retired Loss adjuster, [employer]) She as been married for 14 years.  She has 4 children.  She has a 42-pack-year history, but has stopped smoking since 1984, started at age 34.  Pt occasional has a glass of wine, no drugs.  No exercise program.     ROS: Please see the HPI.  All other systems reviewed and negative.  PHYSICAL EXAM:  BP 112/76  Pulse 49  Ht 5\' 8"  (1.727 m)  Wt 153 lb 6.4 oz (69.582 kg)  BMI 23.32 kg/m2  General: Thin older woman in no distress.   Head:  Normocephalic and atraumatic. Neck: no JVD Lungs: Clear to auscultation and percussion. Heart: Normal S1 and S2.  No murmur, rubs or gallops.  Pulses: Pulses normal in  all 4 extremities. Extremities: No clubbing or cyanosis. No edema. Neurologic: Alert and oriented x 3.  EKG:  Marked sinus bradycardia.  RV conduction delay.  No real change from last tracing of 2012  (rate 52 at that time)  ASSESSMENT AND PLAN:

## 2012-02-28 DIAGNOSIS — R001 Bradycardia, unspecified: Secondary | ICD-10-CM | POA: Insufficient documentation

## 2012-02-28 NOTE — Assessment & Plan Note (Signed)
See heme notes.

## 2012-02-28 NOTE — Assessment & Plan Note (Addendum)
No clinical recurrence.  CHADS VASC 2 is elevated at three, but only on the basis of age and gender.  She also has chronic thrombocytopenia.  I will add to the documentation of overview prior thoughts.  Most recent plt count was 75k, so been reluctant to add chronic anticoagulation to the regimen.  General observation was recommended.

## 2012-02-28 NOTE — Assessment & Plan Note (Signed)
Not symptomatic that I can tell.  Only slightly slower than one year ago.  Continue to monitor.

## 2012-07-05 ENCOUNTER — Telehealth: Payer: Self-pay | Admitting: Hematology & Oncology

## 2012-07-05 NOTE — Telephone Encounter (Signed)
Called pt with change to 9-4 appointment time. Pt canceled appointment said she moved to [place]. MD aware

## 2012-07-12 ENCOUNTER — Other Ambulatory Visit: Payer: Self-pay | Admitting: Lab

## 2012-07-12 ENCOUNTER — Ambulatory Visit: Payer: PRIVATE HEALTH INSURANCE | Admitting: Hematology & Oncology

## 2012-07-12 ENCOUNTER — Encounter: Payer: Self-pay | Admitting: Internal Medicine

## 2012-07-12 ENCOUNTER — Other Ambulatory Visit: Payer: PRIVATE HEALTH INSURANCE | Admitting: Lab

## 2012-07-12 ENCOUNTER — Ambulatory Visit: Payer: Self-pay | Admitting: Medical

## 2013-12-09 DEATH — deceased

## 2013-12-17 ENCOUNTER — Telehealth: Payer: Self-pay

## 2013-12-17 NOTE — Telephone Encounter (Signed)
Patient past away @ [facility] in [place], [place] per Iver Nestle in [place]
